# Patient Record
Sex: Female | Born: 1985 | Race: White | Hispanic: No | Smoking: Never smoker
Health system: Southern US, Community
[De-identification: ages and names within clinical notes are randomized; demographics above are authoritative.]

## PROBLEM LIST (undated history)

## (undated) DIAGNOSIS — F32A Depression, unspecified: Secondary | ICD-10-CM

## (undated) DIAGNOSIS — E079 Disorder of thyroid, unspecified: Secondary | ICD-10-CM

## (undated) DIAGNOSIS — M069 Rheumatoid arthritis, unspecified: Secondary | ICD-10-CM

## (undated) DIAGNOSIS — D649 Anemia, unspecified: Secondary | ICD-10-CM

## (undated) DIAGNOSIS — Z8619 Personal history of other infectious and parasitic diseases: Secondary | ICD-10-CM

## (undated) DIAGNOSIS — K219 Gastro-esophageal reflux disease without esophagitis: Secondary | ICD-10-CM

## (undated) DIAGNOSIS — C719 Malignant neoplasm of brain, unspecified: Secondary | ICD-10-CM

## (undated) DIAGNOSIS — O24419 Gestational diabetes mellitus in pregnancy, unspecified control: Secondary | ICD-10-CM

## (undated) DIAGNOSIS — F419 Anxiety disorder, unspecified: Secondary | ICD-10-CM

## (undated) HISTORY — DX: Personal history of other infectious and parasitic diseases: Z86.19

## (undated) HISTORY — DX: Gestational diabetes mellitus in pregnancy, unspecified control: O24.419

## (undated) HISTORY — DX: Disorder of thyroid, unspecified: E07.9

## (undated) HISTORY — DX: Malignant neoplasm of brain, unspecified: C71.9

## (undated) HISTORY — DX: Gastro-esophageal reflux disease without esophagitis: K21.9

## (undated) HISTORY — DX: Depression, unspecified: F32.A

## (undated) HISTORY — PX: CRANIOTOMY: SHX93

## (undated) HISTORY — PX: MASTOIDECTOMY: SHX711

## (undated) HISTORY — PX: OTHER SURGICAL HISTORY: SHX169

## (undated) HISTORY — DX: Anxiety disorder, unspecified: F41.9

## (undated) HISTORY — DX: Rheumatoid arthritis, unspecified: M06.9

## (undated) HISTORY — DX: Anemia, unspecified: D64.9

## (undated) HISTORY — PX: WISDOM TOOTH EXTRACTION: SHX21

---
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
## 2008-05-18 ENCOUNTER — Emergency Department (HOSPITAL_COMMUNITY): Admission: EM | Admit: 2008-05-18 | Discharge: 2008-05-19 | Payer: Self-pay | Admitting: Emergency Medicine
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
## 2013-10-05 ENCOUNTER — Encounter: Payer: Self-pay | Admitting: Neurology

## 2013-10-05 ENCOUNTER — Ambulatory Visit (INDEPENDENT_AMBULATORY_CARE_PROVIDER_SITE_OTHER): Payer: BC Managed Care – PPO | Admitting: Neurology

## 2013-10-05 VITALS — BP 104/70 | HR 62 | Temp 97.7°F | Ht 59.0 in | Wt 123.0 lb

## 2013-10-05 DIAGNOSIS — G44229 Chronic tension-type headache, not intractable: Secondary | ICD-10-CM

## 2013-10-05 MED ORDER — AMITRIPTYLINE HCL 10 MG PO TABS: 10.0000 mg | ORAL_TABLET | Freq: Every day | ORAL | Status: DC

## 2013-10-05 NOTE — Patient Instructions (Signed)
You most likely have chronic tension headaches. 1.  I would start amitriptyline 10mg  at bedtime.  It is an antidepressant which is commonly used for chronic headache.  Side effects may include sleepiness, dizziness or dry mouth.  Call in 4 weeks with an update. 2.  Follow up in 3 months.

## 2013-10-05 NOTE — Progress Notes (Signed)
NEUROLOGY CONSULTATION NOTE  Autumn Mccoy MRN: [id number] DOB: 07-12-1986  Referring provider: Dr. Cardell Peach (rheumatologist) Primary care provider: Dr. Maisie Fus  Reason for consult:  New onset chronic daily headache.  HISTORY OF PRESENT ILLNESS: Autumn Mccoy is a 27 year old right-handed (left-handed since surgery) woman with history of remote medulloblastoma status post resection, radiation and chemotherapy in 1990s,  and rheumatoid arthritis, who presents for headache.  Records and images were personally reviewed where available.    Onset:  2 months ago.  No prior history of headache. Locatiion:  diffuse Quality:  Non-throbbing Intensity:  9/10 Aura:  No  Associated symptoms:  Initially a little nausea, but not lately.  No photophobia, phonophobia, osmophobia or visual disturbance.  She also reports some neck pain that started around the same time. Duration:  10 minutes up to several hours Frequency:  daily Triggers/exacerbating factors:  Bending over Relieving factors: none  Past abortive therapy:  Prednisone taper (effective only during the taper) Past preventative therapy:  none  Current abortive therapy:  Ibuprofen (rarely took it as it was ineffective) Current preventative therapy:  none  Caffeine:  Not much Sleep hygiene:  okay Stress/depression:  Mild stress Family history:  Her mother was adopted.  No history of headache but she has MS.  MRI of brain with and without contrast (08/24/13):  Images not available.  Report reads "1.  Stable postoperative appearance of the posterior fossa with no evidence of disease recurrence.  2.  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  No evidence of recent hemorrhage.  3.  No new intracranial abnormality.  4.  Chronic right greater than left mastoid effusions.  PAST MEDICAL HISTORY: Past Medical History  Diagnosis Date  . Rheumatoid  arthritis     PAST SURGICAL HISTORY: Past Surgical History  Procedure Laterality Date  . Brain tumor excision  1995    MEDICATIONS: No current outpatient prescriptions on file prior to visit.   No current facility-administered medications on file prior to visit.    ALLERGIES: No Known Allergies  FAMILY HISTORY: No family history on file.  SOCIAL HISTORY: History   Social History  . Marital Status: Unknown    Spouse Name: N/A    Number of Children: N/A  . Years of Education: N/A   Occupational History  . Not on file.   Social History Main Topics  . Smoking status: Never Smoker   . Smokeless tobacco: Never Used  . Alcohol Use: No  . Drug Use: No  . Sexual Activity: Not on file   Other Topics Concern  . Not on file   Social History Narrative  . No narrative on file    REVIEW OF SYSTEMS: Constitutional: No fevers, chills, or sweats, no generalized fatigue, change in appetite Eyes: No visual changes, double vision, eye pain Ear, nose and throat: No hearing loss, ear pain, nasal congestion, sore throat Cardiovascular: No chest pain, palpitations Respiratory:  No shortness of breath at rest or with exertion, wheezes GastrointestinaI: No nausea, vomiting, diarrhea, abdominal pain, fecal incontinence Genitourinary:  No dysuria, urinary retention or frequency Musculoskeletal:  No neck pain, back pain Integumentary: No rash, pruritus, skin lesions Neurological: as above Psychiatric: No depression, insomnia, anxiety Endocrine: No palpitations, fatigue, diaphoresis, mood swings, change in appetite, change in weight, increased thirst Hematologic/Lymphatic:  No anemia, purpura, petechiae. Allergic/Immunologic: no itchy/runny eyes, nasal congestion, recent allergic reactions, rashes  PHYSICAL EXAM: Filed  Vitals:   10/05/13 1341  BP: 104/70  Pulse: 62  Temp: 97.7 F (36.5 C)   General: No acute distress Head:  Normocephalic/atraumatic Neck: supple, no  paraspinal tenderness, full range of motion Back: No paraspinal tenderness Heart: regular rate and rhythm Lungs: Clear to auscultation bilaterally. Vascular: No carotid bruits. Neurological Exam: Mental status: alert and oriented to person, place, and time, speech fluent and not dysarthric, language intact. Cranial nerves: CN I: not tested CN II: pupils equal, round and reactive to light, visual fields intact, fundi unremarkable. CN III, IV, VI:  full range of motion, no nystagmus, no ptosis CN V: facial sensation intact CN VII: upper and lower face symmetric CN VIII: hearing intact CN IX, X: gag intact, uvula midline CN XI: sternocleidomastoid and trapezius muscles intact CN XII: tongue midline Bulk & Tone: normal, no fasciculations. Motor: 5/5 throughout Sensation: pinprick and vibration intact Deep Tendon Reflexes: 2+ throughout, toes down Finger to nose testing: no dysmetria Heel to shin: no dysmetria Gait: right leg limp due to arthritis of the knee. Some unsteadiness with tandem walking.  Romberg negative.  IMPRESSION: Chronic tension headaches.  Initial nausea suggests migraine but that has resolved and semiology not otherwise indicative of migraine.  PLAN: 1.  Start amitriptyline 10mg  at bedtime.  Side effects discussed.  Call in 4 weeks with update. 2.  Also consider PT of the neck in the future. 3.  Follow up in 3 months.  Thank you for allowing me to take part in the care of this patient.  Shon Millet, DO  CC:  Robb Matar, MD  Mariah Milling, MD

## 2013-11-09 ENCOUNTER — Telehealth: Payer: Self-pay | Admitting: Neurology

## 2013-11-09 NOTE — Telephone Encounter (Signed)
Call about med concerns, CB# [phone]. / Sherri S.

## 2013-11-10 MED ORDER — AMITRIPTYLINE HCL 10 MG PO TABS: 10.0000 mg | ORAL_TABLET | Freq: Every day | ORAL | Status: DC

## 2013-11-10 NOTE — Telephone Encounter (Signed)
Okay to refill amitriptyline 10mg  at bedtime, #30, refill 6

## 2013-11-10 NOTE — Telephone Encounter (Signed)
Patient called stating elavil is working somewhat the headaches are less she is asking for a refill please advise

## 2013-11-20 ENCOUNTER — Telehealth: Payer: Self-pay | Admitting: Neurology

## 2013-11-20 NOTE — Telephone Encounter (Signed)
Called number and left message on voicemail to call us back.

## 2013-11-20 NOTE — Telephone Encounter (Signed)
Patient would like to talk with dr Tomi Likens please call [phone]

## 2013-11-23 NOTE — Telephone Encounter (Signed)
Patient has some questions please call.

## 2013-12-21 LAB — OB RESULTS CONSOLE ABO/RH: RH Type: POSITIVE

## 2013-12-21 LAB — OB RESULTS CONSOLE RPR: RPR: NONREACTIVE

## 2013-12-21 LAB — OB RESULTS CONSOLE GC/CHLAMYDIA
CHLAMYDIA, DNA PROBE: NEGATIVE
GC PROBE AMP, GENITAL: NEGATIVE

## 2013-12-21 LAB — OB RESULTS CONSOLE VARICELLA ZOSTER ANTIBODY, IGG: Varicella: IMMUNE

## 2013-12-21 LAB — OB RESULTS CONSOLE ANTIBODY SCREEN: Antibody Screen: NEGATIVE

## 2013-12-21 LAB — OB RESULTS CONSOLE HEPATITIS B SURFACE ANTIGEN: Hepatitis B Surface Ag: NEGATIVE

## 2013-12-21 LAB — OB RESULTS CONSOLE RUBELLA ANTIBODY, IGM: RUBELLA: IMMUNE

## 2013-12-21 LAB — OB RESULTS CONSOLE HIV ANTIBODY (ROUTINE TESTING): HIV: NONREACTIVE

## 2014-01-05 ENCOUNTER — Ambulatory Visit: Payer: BC Managed Care – PPO | Admitting: Neurology

## 2014-01-05 ENCOUNTER — Telehealth: Payer: Self-pay | Admitting: Neurology

## 2014-01-05 NOTE — Telephone Encounter (Signed)
Pt no showed today's follow up appt w/ Dr. Jaffe. No show letter mailed to pt / Sherri S.  °

## 2014-01-05 NOTE — Telephone Encounter (Signed)
Pt no showed today's follow up appt w/ Dr. Tomi Likens. No show letter mailed to pt / Sherri S.

## 2014-05-14 ENCOUNTER — Inpatient Hospital Stay (HOSPITAL_COMMUNITY): Admission: AD | Admit: 2014-05-14 | Payer: Self-pay | Source: Ambulatory Visit | Admitting: Obstetrics and Gynecology

## 2014-05-19 ENCOUNTER — Encounter: Payer: BC Managed Care – PPO | Attending: Obstetrics and Gynecology

## 2014-05-19 VITALS — Ht 59.0 in | Wt 137.6 lb

## 2014-05-19 DIAGNOSIS — O99810 Abnormal glucose complicating pregnancy: Secondary | ICD-10-CM

## 2014-05-19 DIAGNOSIS — Z713 Dietary counseling and surveillance: Secondary | ICD-10-CM | POA: Insufficient documentation

## 2014-05-20 NOTE — Progress Notes (Signed)
  Patient was seen on 05/19/14 for Gestational Diabetes self-management class at the [facility]. The following learning objectives were met by the patient during this course:   States the definition of Gestational Diabetes  States why dietary management is important in controlling blood glucose  Describes the effects each nutrient has on blood glucose levels  Demonstrates ability to create a balanced meal plan  Demonstrates carbohydrate counting   States when to check blood glucose levels  Demonstrates proper blood glucose monitoring techniques  States the effect of stress and exercise on blood glucose levels  States the importance of limiting caffeine and abstaining from alcohol and smoking  Blood glucose monitor given: Accu Chek Nano BG Monitoring Kit Lot # [id number] Exp: 04/07/2015 Blood glucose reading: 86 mg/dl  Patient instructed to monitor glucose levels: FBS: 60 - <90 1 hour: <140 2 hour: <120  *Patient received handouts:  Nutrition Diabetes and Pregnancy  Carbohydrate Counting List  Patient will be seen for follow-up as needed.

## 2014-06-22 LAB — OB RESULTS CONSOLE GBS: GBS: POSITIVE

## 2014-07-19 ENCOUNTER — Telehealth (HOSPITAL_COMMUNITY): Payer: Self-pay | Admitting: *Deleted

## 2014-07-19 ENCOUNTER — Encounter (HOSPITAL_COMMUNITY): Payer: Self-pay | Admitting: *Deleted

## 2014-07-19 NOTE — Telephone Encounter (Signed)
Preadmission screen  
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
## 2014-07-22 ENCOUNTER — Inpatient Hospital Stay (HOSPITAL_COMMUNITY): Payer: BC Managed Care – PPO | Admitting: Anesthesiology

## 2014-07-22 ENCOUNTER — Encounter (HOSPITAL_COMMUNITY): Payer: Self-pay

## 2014-07-22 ENCOUNTER — Inpatient Hospital Stay (HOSPITAL_COMMUNITY)
Admission: RE | Admit: 2014-07-22 | Discharge: 2014-07-25 | DRG: 766 | Disposition: A | Discharge status: Home / Self Care | Payer: BC Managed Care – PPO | Source: Ambulatory Visit | Attending: Obstetrics and Gynecology | Admitting: Obstetrics and Gynecology

## 2014-07-22 ENCOUNTER — Encounter (HOSPITAL_COMMUNITY)
Admission: RE | Disposition: A | Discharge status: Home / Self Care | Payer: Self-pay | Source: Ambulatory Visit | Attending: Obstetrics and Gynecology

## 2014-07-22 ENCOUNTER — Encounter (HOSPITAL_COMMUNITY): Payer: BC Managed Care – PPO | Admitting: Anesthesiology

## 2014-07-22 VITALS — BP 100/68 | HR 55 | Temp 98.2°F | Resp 18 | Ht 59.0 in | Wt 137.0 lb

## 2014-07-22 DIAGNOSIS — Z3493 Encounter for supervision of normal pregnancy, unspecified, third trimester: Secondary | ICD-10-CM

## 2014-07-22 DIAGNOSIS — O24420 Gestational diabetes mellitus in childbirth, diet controlled: Principal | ICD-10-CM | POA: Diagnosis present

## 2014-07-22 DIAGNOSIS — Z85841 Personal history of malignant neoplasm of brain: Secondary | ICD-10-CM

## 2014-07-22 DIAGNOSIS — Z3A39 39 weeks gestation of pregnancy: Secondary | ICD-10-CM | POA: Diagnosis present

## 2014-07-22 DIAGNOSIS — O99824 Streptococcus B carrier state complicating childbirth: Secondary | ICD-10-CM | POA: Diagnosis present

## 2014-07-22 DIAGNOSIS — Z98891 History of uterine scar from previous surgery: Secondary | ICD-10-CM

## 2014-07-22 LAB — TYPE AND SCREEN
ABO/RH(D): A POS
Antibody Screen: NEGATIVE

## 2014-07-22 LAB — CBC
HCT: 35.3 % — ABNORMAL LOW (ref 36.0–46.0)
Hemoglobin: 12.2 g/dL (ref 12.0–15.0)
MCH: 31.9 pg (ref 26.0–34.0)
MCHC: 34.6 g/dL (ref 30.0–36.0)
MCV: 92.4 fL (ref 78.0–100.0)
Platelets: 197 10*3/uL (ref 150–400)
RBC: 3.82 MIL/uL — ABNORMAL LOW (ref 3.87–5.11)
RDW: 13 % (ref 11.5–15.5)
WBC: 9 10*3/uL (ref 4.0–10.5)

## 2014-07-22 LAB — RPR

## 2014-07-22 LAB — ABO/RH: ABO/RH(D): A POS

## 2014-07-22 LAB — GLUCOSE, CAPILLARY
GLUCOSE-CAPILLARY: 101 mg/dL — AB (ref 70–99)
Glucose-Capillary: 79 mg/dL (ref 70–99)

## 2014-07-22 SURGERY — Surgical Case
Anesthesia: Epidural | Site: Abdomen

## 2014-07-22 MED ORDER — LACTATED RINGERS IV SOLN: 500.0000 mL | Freq: Once | INTRAVENOUS | Status: DC

## 2014-07-22 MED ORDER — OXYTOCIN BOLUS FROM INFUSION: 500.0000 mL | INTRAVENOUS | Status: DC

## 2014-07-22 MED ORDER — LACTATED RINGERS IV SOLN
500.0000 mL | INTRAVENOUS | Status: DC | PRN
  Administered 2014-07-22: 1000 mL via INTRAVENOUS

## 2014-07-22 MED ORDER — EPHEDRINE 5 MG/ML INJ: 10.0000 mg | INTRAVENOUS | Status: DC | PRN

## 2014-07-22 MED ORDER — TERBUTALINE SULFATE 1 MG/ML IJ SOLN: 0.2500 mg | Freq: Once | INTRAMUSCULAR | Status: AC | PRN

## 2014-07-22 MED ORDER — SODIUM BICARBONATE 8.4 % IV SOLN
INTRAVENOUS | Status: AC
  Filled 2014-07-22: qty 50

## 2014-07-22 MED ORDER — OXYTOCIN 10 UNIT/ML IJ SOLN
40.0000 [IU] | INTRAVENOUS | Status: DC | PRN
  Administered 2014-07-22: 40 [IU] via INTRAVENOUS

## 2014-07-22 MED ORDER — OXYTOCIN 10 UNIT/ML IJ SOLN: INTRAMUSCULAR | Status: DC | PRN

## 2014-07-22 MED ORDER — LIDOCAINE-EPINEPHRINE (PF) 2 %-1:200000 IJ SOLN
INTRAMUSCULAR | Status: AC
  Filled 2014-07-22: qty 20

## 2014-07-22 MED ORDER — ONDANSETRON HCL 4 MG/2ML IJ SOLN: 4.0000 mg | Freq: Four times a day (QID) | INTRAMUSCULAR | Status: DC | PRN

## 2014-07-22 MED ORDER — LIDOCAINE HCL (PF) 1 % IJ SOLN
30.0000 mL | INTRAMUSCULAR | Status: DC | PRN
  Filled 2014-07-22: qty 30

## 2014-07-22 MED ORDER — MORPHINE SULFATE (PF) 0.5 MG/ML IJ SOLN
INTRAMUSCULAR | Status: DC | PRN
  Administered 2014-07-22: 3 mg via EPIDURAL

## 2014-07-22 MED ORDER — DIPHENHYDRAMINE HCL 50 MG/ML IJ SOLN: 12.5000 mg | INTRAMUSCULAR | Status: DC | PRN

## 2014-07-22 MED ORDER — ACETAMINOPHEN 325 MG PO TABS
650.0000 mg | ORAL_TABLET | ORAL | Status: DC | PRN
Start: 2014-07-22 — End: 2014-07-23

## 2014-07-22 MED ORDER — LACTATED RINGERS IV SOLN
INTRAVENOUS | Status: DC | PRN
  Administered 2014-07-22 (×2): via INTRAVENOUS

## 2014-07-22 MED ORDER — FENTANYL 2.5 MCG/ML BUPIVACAINE 1/10 % EPIDURAL INFUSION (WH - ANES)
14.0000 mL/h | INTRAMUSCULAR | Status: DC | PRN
  Administered 2014-07-22 (×2): 14 mL/h via EPIDURAL
  Filled 2014-07-22 (×2): qty 125

## 2014-07-22 MED ORDER — LACTATED RINGERS IV SOLN
INTRAVENOUS | Status: DC
  Administered 2014-07-22 (×7): via INTRAVENOUS

## 2014-07-22 MED ORDER — CITRIC ACID-SODIUM CITRATE 334-500 MG/5ML PO SOLN
30.0000 mL | ORAL | Status: DC | PRN
  Administered 2014-07-22: 30 mL via ORAL
  Filled 2014-07-22: qty 15

## 2014-07-22 MED ORDER — PENICILLIN G POTASSIUM 5000000 UNITS IJ SOLR
2.5000 10*6.[IU] | INTRAVENOUS | Status: DC
  Administered 2014-07-22 (×3): 2.5 10*6.[IU] via INTRAVENOUS
  Filled 2014-07-22 (×5): qty 2.5

## 2014-07-22 MED ORDER — OXYTOCIN 10 UNIT/ML IJ SOLN
INTRAMUSCULAR | Status: AC
  Filled 2014-07-22: qty 4

## 2014-07-22 MED ORDER — SODIUM BICARBONATE 8.4 % IV SOLN
INTRAVENOUS | Status: DC | PRN
  Administered 2014-07-22 (×2): 5 mL via EPIDURAL

## 2014-07-22 MED ORDER — PHENYLEPHRINE 40 MCG/ML (10ML) SYRINGE FOR IV PUSH (FOR BLOOD PRESSURE SUPPORT)
PREFILLED_SYRINGE | INTRAVENOUS | Status: AC
  Filled 2014-07-22: qty 5

## 2014-07-22 MED ORDER — PHENYLEPHRINE 40 MCG/ML (10ML) SYRINGE FOR IV PUSH (FOR BLOOD PRESSURE SUPPORT): 80.0000 ug | PREFILLED_SYRINGE | INTRAVENOUS | Status: DC | PRN

## 2014-07-22 MED ORDER — PHENYLEPHRINE 40 MCG/ML (10ML) SYRINGE FOR IV PUSH (FOR BLOOD PRESSURE SUPPORT)
80.0000 ug | PREFILLED_SYRINGE | INTRAVENOUS | Status: DC | PRN
  Filled 2014-07-22: qty 10

## 2014-07-22 MED ORDER — CEFAZOLIN SODIUM-DEXTROSE 2-3 GM-% IV SOLR
INTRAVENOUS | Status: AC
  Filled 2014-07-22: qty 50

## 2014-07-22 MED ORDER — OXYCODONE-ACETAMINOPHEN 5-325 MG PO TABS: 2.0000 | ORAL_TABLET | ORAL | Status: DC | PRN

## 2014-07-22 MED ORDER — BUTORPHANOL TARTRATE 1 MG/ML IJ SOLN: 1.0000 mg | INTRAMUSCULAR | Status: DC | PRN

## 2014-07-22 MED ORDER — ONDANSETRON HCL 4 MG/2ML IJ SOLN
INTRAMUSCULAR | Status: AC
  Filled 2014-07-22: qty 2

## 2014-07-22 MED ORDER — CEFAZOLIN SODIUM-DEXTROSE 2-3 GM-% IV SOLR
2.0000 g | INTRAVENOUS | Status: DC
  Filled 2014-07-22: qty 50

## 2014-07-22 MED ORDER — OXYTOCIN 40 UNITS IN LACTATED RINGERS INFUSION - SIMPLE MED
1.0000 m[IU]/min | INTRAVENOUS | Status: DC
  Administered 2014-07-22: 2 m[IU]/min via INTRAVENOUS
  Administered 2014-07-22: 6 m[IU]/min via INTRAVENOUS
  Filled 2014-07-22: qty 1000

## 2014-07-22 MED ORDER — MORPHINE SULFATE 0.5 MG/ML IJ SOLN
INTRAMUSCULAR | Status: AC
  Filled 2014-07-22: qty 10

## 2014-07-22 MED ORDER — LIDOCAINE HCL (PF) 1 % IJ SOLN
INTRAMUSCULAR | Status: DC | PRN
  Administered 2014-07-22 (×2): 5 mL

## 2014-07-22 MED ORDER — OXYCODONE-ACETAMINOPHEN 5-325 MG PO TABS: 1.0000 | ORAL_TABLET | ORAL | Status: DC | PRN

## 2014-07-22 MED ORDER — PENICILLIN G POTASSIUM 5000000 UNITS IJ SOLR
5.0000 10*6.[IU] | Freq: Once | INTRAVENOUS | Status: AC
  Administered 2014-07-22: 5 10*6.[IU] via INTRAVENOUS
  Filled 2014-07-22: qty 5

## 2014-07-22 MED ORDER — ONDANSETRON HCL 4 MG/2ML IJ SOLN
INTRAMUSCULAR | Status: DC | PRN
  Administered 2014-07-22: 4 mg via INTRAVENOUS

## 2014-07-22 MED ORDER — CEFAZOLIN (ANCEF) 1 G IV SOLR: 2.0000 g | INTRAVENOUS | Status: DC

## 2014-07-22 MED ORDER — CEFAZOLIN SODIUM-DEXTROSE 2-3 GM-% IV SOLR
INTRAVENOUS | Status: DC | PRN
  Administered 2014-07-22: 2 g via INTRAVENOUS

## 2014-07-22 MED ORDER — OXYTOCIN 40 UNITS IN LACTATED RINGERS INFUSION - SIMPLE MED: 62.5000 mL/h | INTRAVENOUS | Status: DC

## 2014-07-22 SURGICAL SUPPLY — 33 items
APL SKNCLS STERI-STRIP NONHPOA (GAUZE/BANDAGES/DRESSINGS) ×1
BENZOIN TINCTURE PRP APPL 2/3 (GAUZE/BANDAGES/DRESSINGS) ×2 IMPLANT
CLAMP CORD UMBIL (MISCELLANEOUS) IMPLANT
CLOTH BEACON ORANGE TIMEOUT ST (SAFETY) ×2 IMPLANT
DRAPE SHEET LG 3/4 BI-LAMINATE (DRAPES) IMPLANT
DRSG OPSITE POSTOP 4X10 (GAUZE/BANDAGES/DRESSINGS) ×2 IMPLANT
DURAPREP 26ML APPLICATOR (WOUND CARE) ×2 IMPLANT
ELECT REM PT RETURN 9FT ADLT (ELECTROSURGICAL) ×2
ELECTRODE REM PT RTRN 9FT ADLT (ELECTROSURGICAL) ×1 IMPLANT
EXTRACTOR VACUUM KIWI (MISCELLANEOUS) IMPLANT
GLOVE BIO SURGEON STRL SZ 6.5 (GLOVE) ×2 IMPLANT
GOWN STRL REUS W/TWL LRG LVL3 (GOWN DISPOSABLE) ×4 IMPLANT
KIT ABG SYR 3ML LUER SLIP (SYRINGE) IMPLANT
NDL HYPO 25X5/8 SAFETYGLIDE (NEEDLE) IMPLANT
NEEDLE HYPO 25X5/8 SAFETYGLIDE (NEEDLE) IMPLANT
NS IRRIG 1000ML POUR BTL (IV SOLUTION) ×2 IMPLANT
PACK C SECTION WH (CUSTOM PROCEDURE TRAY) ×2 IMPLANT
PAD OB MATERNITY 4.3X12.25 (PERSONAL CARE ITEMS) ×2 IMPLANT
RTRCTR C-SECT PINK 25CM LRG (MISCELLANEOUS) ×2 IMPLANT
STRIP CLOSURE SKIN 1/2X4 (GAUZE/BANDAGES/DRESSINGS) ×2 IMPLANT
SUT CHROMIC 1 CTX 36 (SUTURE) ×4 IMPLANT
SUT PLAIN 0 NONE (SUTURE) IMPLANT
SUT PLAIN 2 0 XLH (SUTURE) ×2 IMPLANT
SUT VIC AB 0 CT1 27 (SUTURE) ×4
SUT VIC AB 0 CT1 27XBRD ANBCTR (SUTURE) ×2 IMPLANT
SUT VIC AB 2-0 CT1 27 (SUTURE) ×2
SUT VIC AB 2-0 CT1 TAPERPNT 27 (SUTURE) ×1 IMPLANT
SUT VIC AB 3-0 CT1 27 (SUTURE)
SUT VIC AB 3-0 CT1 TAPERPNT 27 (SUTURE) IMPLANT
SUT VIC AB 4-0 KS 27 (SUTURE) ×2 IMPLANT
TOWEL OR 17X24 6PK STRL BLUE (TOWEL DISPOSABLE) ×2 IMPLANT
TRAY FOLEY CATH 14FR (SET/KITS/TRAYS/PACK) ×1 IMPLANT
WATER STERILE IRR 1000ML POUR (IV SOLUTION) ×1 IMPLANT

## 2014-07-22 NOTE — Progress Notes (Signed)
Patient ID: Autumn Mccoy, female   DOB: 1985-11-24, 28 y.o.   MRN: [id number] Pt still comfortable with epidural  afeb vss Cervix c/rim/+1 Cervix reducible but fetal vertex still OP Attempted pushing with several contractions to see if vertex would rotate and descend, But after 15 mins of pushing no reall progress was made despite good effort.  FHR having repetitive variable decels with pushing and slowed recovery so pitocin d/c'd for 15 min and then restarted at 48mu.

## 2014-07-22 NOTE — Progress Notes (Signed)
Patient ID: Fleeta Emmer Katen, female   DOB: [birth year], 28 y.o.   MRN: [id number] Pt still comfortable with epidural FHR with good variability but repetitive deep variable decels with pushing  Cervix still has reducible rim and vertex not advanced any in last 3 hours with passive labor--OP  Attempted pushing again to see if vertex would advance.  No real progress made and FHR began to have long deep variables to 60  With prolonged recovery.   D/w pt and family situation in detail and that I feel she is going to need a c-section to deliver. We discussed the risks of bleeding, infection and possible damage to bowel and bladder. Pt agreeable to proceeding.   OR notified and pitocin d/c'd.  FHR stable off pitocin.  Will proceed when OR ready.

## 2014-07-22 NOTE — Progress Notes (Signed)
Patient ID: Autumn Mccoy, female   DOB: 02-04-1986, 28 y.o.   MRN: [id number] Pt comfortable with epidural.  Some pressure  afeb vss Cervix c/9+/0 Fels OP  FHR baseline 120 with good variability, some variable decels Will continue to follow progress and allow vertex to descend more before pushing

## 2014-07-22 NOTE — Transfer of Care (Signed)
Immediate Anesthesia Transfer of Care Note  Patient: Autumn Mccoy  Procedure(s) Performed: Procedure(s): CESAREAN SECTION (N/A)  Patient Location: PACU  Anesthesia Type:Epidural  Level of Consciousness: awake, alert , oriented and patient cooperative  Airway & Oxygen Therapy: Patient Spontanous Breathing  Post-op Assessment: Report given to PACU RN and Post -op Vital signs reviewed and stable  Post vital signs: Reviewed and stable  Complications: No apparent anesthesia complications

## 2014-07-22 NOTE — Progress Notes (Signed)
Patient ID: Autumn Mccoy, female   DOB: 1986-07-26, 28 y.o.   MRN: [id number] Pt with mild contractions  afeb vss FHR category 1  Cervix 70/3/-2 AROM slightly blood tinged, but no meconium  PCN on board for +GBS  Pitocin at 52mu. Follow progress  Plans epidural.

## 2014-07-22 NOTE — Anesthesia Preprocedure Evaluation (Addendum)
Anesthesia Evaluation  Patient identified by MRN, date of birth, ID band Patient awake    Reviewed: Allergy & Precautions, H&P , NPO status , Patient's Chart, lab work & pertinent test results  Airway Mallampati: II TM Distance: >3 FB Neck ROM: full    Dental   Pulmonary neg pulmonary ROS,  breath sounds clear to auscultation  Pulmonary exam normal       Cardiovascular negative cardio ROS  Rhythm:regular Rate:Normal     Neuro/Psych negative neurological ROS  negative psych ROS   GI/Hepatic negative GI ROS, Neg liver ROS,   Endo/Other  diabetes  Renal/GU      Musculoskeletal  (+) Arthritis -, Rheumatoid disorders,    Abdominal Normal abdominal exam  (+)   Peds  Hematology negative hematology ROS (+) anemia ,   Anesthesia Other Findings S/p brain tumor resection at age 28- no residual issues Scoliosis RA- off humera during pregnancy  Reproductive/Obstetrics (+) Pregnancy                        Anesthesia Physical Anesthesia Plan  ASA: III and emergent  Anesthesia Plan: Epidural   Post-op Pain Management:    Induction:   Airway Management Planned: Natural Airway  Additional Equipment:   Intra-op Plan:   Post-operative Plan:   Informed Consent: I have reviewed the patients History and Physical, chart, labs and discussed the procedure including the risks, benefits and alternatives for the proposed anesthesia with the patient or authorized representative who has indicated his/her understanding and acceptance.     Plan Discussed with: Anesthesiologist, CRNA and Surgeon  Anesthesia Plan Comments: (Patient for C/Section for arrest of descent and decelerations. Will use epidural for C/Section. M.Royce Macadamia, MD)      Anesthesia Quick Evaluation

## 2014-07-22 NOTE — Progress Notes (Signed)
Patient ID: Autumn Mccoy, female   DOB: March 27, 1986, 28 y.o.   MRN: [id number] Pt comfortable with epidural FHR category 1 Cervix 90/5/-1  IUPC placed Continue to follow progress.

## 2014-07-22 NOTE — Anesthesia Procedure Notes (Signed)
Epidural Patient location during procedure: OB Start time: 07/22/2014 10:53 AM  Staffing Anesthesiologist: Rudean Curt Performed by: anesthesiologist   Preanesthetic Checklist Completed: patient identified, site marked, surgical consent, pre-op evaluation, timeout performed, IV checked, risks and benefits discussed and monitors and equipment checked  Epidural Patient position: sitting Prep: site prepped and draped and DuraPrep Patient monitoring: continuous pulse ox and blood pressure Approach: midline Location: L3-L4 Injection technique: LOR air  Needle:  Needle type: Tuohy  Needle gauge: 17 G Needle length: 9 cm and 9 Needle insertion depth: 5 cm cm Catheter type: closed end flexible Catheter size: 19 Gauge Catheter at skin depth: 10 cm Test dose: negative  Assessment Events: blood not aspirated, injection not painful, no injection resistance, negative IV test and no paresthesia  Additional Notes Patient identified.  Risk benefits discussed including failed block, incomplete pain control, headache, nerve damage, paralysis, blood pressure changes, nausea, vomiting, reactions to medication both toxic or allergic, and postpartum back pain.  Patient expressed understanding and wished to proceed.  All questions were answered.  Sterile technique used throughout procedure and epidural site dressed with sterile barrier dressing. No paresthesia or other complications noted.The patient did not experience any signs of intravascular injection such as tinnitus or metallic taste in mouth nor signs of intrathecal spread such as rapid motor block. Please see nursing notes for vital signs.

## 2014-07-22 NOTE — Op Note (Addendum)
Operative note  Preoperative diagnosis Term pregnancy at 39 weeks and 4 days Gestational diabetes mellitus Arrest of dilation and descent OP presentation  Postoperative diagnosis Same  Procedure Primary low transverse C-section with 2 layer closure of uterus  Surgeon Dr. Paula Compton  Anesthesia Epidural  Fluids Estimated blood loss 800 cc Urine output 300 cc blood-tinged urine, clearing by the end of case IV fluids 1800 cc LR  Findings There was a viable female infant in the vertex presentation. Apgars were 8 and 9. Weight pending at time of dictation. Uterus ovaries and tubes were normal in appearance.  Specimen Placenta sent to L&D  Procedure note Patient was taken to the operating room where epidural anesthesia was found to be adequate by Allis clamp test. She was prepped and draped in the normal sterile fashion in the dorsal supine position with a leftward tilt. An appropriate time out was performed. A Pfannenstiel skin incision was then made with the scalpel and carried through to the underlying layer of fascia by sharp dissection and Bovie cautery. The fascia was nicked in the midline and the incision was extended laterally with Mayo scissors. The superior aspect was dissected off the rectus muscles. Rectus muscles were separated in the midline and the peritoneal cavity entered bluntly. The peritoneal incision was then extended both superiorly and inferiorly with careful attention to avoid both bowel and bladder. The Alexis self-retaining wound retractor was then placed within the incision and the lower uterine segment exposed. The bladder flap was developed with Metzenbaum scissors and pushed away from the lower uterine segment. The lower uterine segment was then incised in a transverse fashion and the cavity itself entered bluntly. The incision was extended bluntly. The infant's head was then lifted and delivered from the incision without difficulty. The remainder of the  infant delivered and the nose and mouth bulb suctioned with the cord clamped and cut as well. The infant was handed off to the waiting pediatricians. The placenta was then spontaneously expressed from the uterus and the uterus cleared of all clots and debris with moist lap sponge. The uterine incision was then repaired in 2 layers the first layer was a running locked layer 1-0 chromic and the second an imbricating layer of the same suture. The tubes and ovaries were inspected and the gutters cleared of all clots and debris. The uterine incision was inspected and found to be hemostatic  There was a small window in the left broad ligament but it was not closed due to its proximity to the bladder. All appeared hemostatic in that area. All instruments and sponges as well as the Alexis retractor were then removed from the abdomen. The rectus muscles and peritoneum were then reapproximated with several interrupted mattress sutures of 2-0 Vicryl. The fascia was then closed with 0 Vicryl in a running fashion. Subcutaneous tissue was reapproximated with 3-0 plain in a running fashion. The skin was closed with a subcuticular stitch of 4-0 Vicryl on a Keith needle and then reinforced with benzoin and Steri-Strips. At the conclusion of the procedure all instruments and sponge counts were correct. Patient was taken to the recovery room in good condition with her baby accompanying her skin to skin.

## 2014-07-23 ENCOUNTER — Encounter (HOSPITAL_COMMUNITY): Payer: Self-pay

## 2014-07-23 DIAGNOSIS — Z98891 History of uterine scar from previous surgery: Secondary | ICD-10-CM

## 2014-07-23 LAB — COMPREHENSIVE METABOLIC PANEL
ALBUMIN: 1.9 g/dL — AB (ref 3.5–5.2)
ALK PHOS: 92 U/L (ref 39–117)
ALT: 11 U/L (ref 0–35)
ANION GAP: 11 (ref 5–15)
AST: 30 U/L (ref 0–37)
BUN: 5 mg/dL — AB (ref 6–23)
CALCIUM: 8.3 mg/dL — AB (ref 8.4–10.5)
CO2: 22 mEq/L (ref 19–32)
CREATININE: 0.67 mg/dL (ref 0.50–1.10)
Chloride: 102 mEq/L (ref 96–112)
GFR calc Af Amer: >90 mL/min (ref >90)
GFR calc non Af Amer: >90 mL/min (ref >90)
Glucose, Bld: 105 mg/dL — ABNORMAL HIGH (ref 70–99)
POTASSIUM: 4.2 meq/L (ref 3.7–5.3)
Sodium: 135 mEq/L — ABNORMAL LOW (ref 137–147)
TOTAL PROTEIN: 4.8 g/dL — AB (ref 6.0–8.3)
Total Bilirubin: 0.4 mg/dL (ref 0.3–1.2)

## 2014-07-23 LAB — CBC
HCT: 31 % — ABNORMAL LOW (ref 36.0–46.0)
Hemoglobin: 11 g/dL — ABNORMAL LOW (ref 12.0–15.0)
MCH: 32.4 pg (ref 26.0–34.0)
MCHC: 35.5 g/dL (ref 30.0–36.0)
MCV: 91.4 fL (ref 78.0–100.0)
Platelets: 135 10*3/uL — ABNORMAL LOW (ref 150–400)
RBC: 3.39 MIL/uL — ABNORMAL LOW (ref 3.87–5.11)
RDW: 12.8 % (ref 11.5–15.5)
WBC: 15.1 10*3/uL — ABNORMAL HIGH (ref 4.0–10.5)

## 2014-07-23 MED ORDER — KETOROLAC TROMETHAMINE 30 MG/ML IJ SOLN
30.0000 mg | Freq: Once | INTRAMUSCULAR | Status: AC
  Administered 2014-07-23: 30 mg via INTRAVENOUS

## 2014-07-23 MED ORDER — FENTANYL CITRATE 0.05 MG/ML IJ SOLN
INTRAMUSCULAR | Status: AC
  Filled 2014-07-23: qty 2

## 2014-07-23 MED ORDER — ONDANSETRON HCL 4 MG/2ML IJ SOLN: 4.0000 mg | Freq: Three times a day (TID) | INTRAMUSCULAR | Status: DC | PRN

## 2014-07-23 MED ORDER — NALBUPHINE HCL 10 MG/ML IJ SOLN: 5.0000 mg | INTRAMUSCULAR | Status: DC | PRN

## 2014-07-23 MED ORDER — MEPERIDINE HCL 25 MG/ML IJ SOLN
INTRAMUSCULAR | Status: AC
  Filled 2014-07-23: qty 1

## 2014-07-23 MED ORDER — ONDANSETRON HCL 4 MG/2ML IJ SOLN: 4.0000 mg | Freq: Four times a day (QID) | INTRAMUSCULAR | Status: DC | PRN

## 2014-07-23 MED ORDER — ONDANSETRON HCL 4 MG/2ML IJ SOLN: 4.0000 mg | INTRAMUSCULAR | Status: DC | PRN

## 2014-07-23 MED ORDER — OXYCODONE-ACETAMINOPHEN 5-325 MG PO TABS: 1.0000 | ORAL_TABLET | ORAL | Status: DC | PRN

## 2014-07-23 MED ORDER — MENTHOL 3 MG MT LOZG
1.0000 | LOZENGE | OROMUCOSAL | Status: DC | PRN
Start: 2014-07-23 — End: 2014-07-25

## 2014-07-23 MED ORDER — SIMETHICONE 80 MG PO CHEW: 80.0000 mg | CHEWABLE_TABLET | ORAL | Status: DC | PRN

## 2014-07-23 MED ORDER — NALOXONE HCL 0.4 MG/ML IJ SOLN: 0.4000 mg | INTRAMUSCULAR | Status: DC | PRN

## 2014-07-23 MED ORDER — DIBUCAINE 1 % RE OINT: 1.0000 "application " | TOPICAL_OINTMENT | RECTAL | Status: DC | PRN

## 2014-07-23 MED ORDER — ZOLPIDEM TARTRATE 5 MG PO TABS: 5.0000 mg | ORAL_TABLET | Freq: Every evening | ORAL | Status: DC | PRN

## 2014-07-23 MED ORDER — DIPHENHYDRAMINE HCL 25 MG PO CAPS: 25.0000 mg | ORAL_CAPSULE | Freq: Four times a day (QID) | ORAL | Status: DC | PRN

## 2014-07-23 MED ORDER — MORPHINE SULFATE 4 MG/ML IJ SOLN
4.0000 mg | Freq: Once | INTRAMUSCULAR | Status: AC
  Administered 2014-07-23: 4 mg via INTRAVENOUS

## 2014-07-23 MED ORDER — INFLUENZA VAC SPLIT QUAD 0.5 ML IM SUSY
0.5000 mL | PREFILLED_SYRINGE | INTRAMUSCULAR | Status: AC
  Administered 2014-07-24: 0.5 mL via INTRAMUSCULAR

## 2014-07-23 MED ORDER — OXYTOCIN 40 UNITS IN LACTATED RINGERS INFUSION - SIMPLE MED: 62.5000 mL/h | INTRAVENOUS | Status: AC

## 2014-07-23 MED ORDER — SODIUM CHLORIDE 0.9 % IJ SOLN: 9.0000 mL | INTRAMUSCULAR | Status: DC | PRN

## 2014-07-23 MED ORDER — FENTANYL CITRATE 0.05 MG/ML IJ SOLN
25.0000 ug | INTRAMUSCULAR | Status: DC | PRN
  Administered 2014-07-23 (×4): 50 ug via INTRAVENOUS

## 2014-07-23 MED ORDER — HYDROMORPHONE HCL 1 MG/ML IJ SOLN
INTRAMUSCULAR | Status: AC
  Filled 2014-07-23: qty 1

## 2014-07-23 MED ORDER — LACTATED RINGERS IV SOLN: INTRAVENOUS | Status: DC

## 2014-07-23 MED ORDER — NALBUPHINE HCL 10 MG/ML IJ SOLN: 5.0000 mg | Freq: Once | INTRAMUSCULAR | Status: AC | PRN

## 2014-07-23 MED ORDER — TETANUS-DIPHTH-ACELL PERTUSSIS 5-2.5-18.5 LF-MCG/0.5 IM SUSP: 0.5000 mL | Freq: Once | INTRAMUSCULAR | Status: DC

## 2014-07-23 MED ORDER — OXYCODONE-ACETAMINOPHEN 5-325 MG PO TABS
2.0000 | ORAL_TABLET | ORAL | Status: DC | PRN
  Administered 2014-07-23 – 2014-07-25 (×9): 2 via ORAL
  Filled 2014-07-23 (×9): qty 2

## 2014-07-23 MED ORDER — MORPHINE SULFATE (PF) 1 MG/ML IV SOLN
INTRAVENOUS | Status: DC
  Administered 2014-07-23: 07:00:00 via INTRAVENOUS
  Filled 2014-07-23: qty 25

## 2014-07-23 MED ORDER — LACTATED RINGERS IV BOLUS (SEPSIS)
500.0000 mL | Freq: Once | INTRAVENOUS | Status: AC
  Administered 2014-07-23: 500 mL via INTRAVENOUS

## 2014-07-23 MED ORDER — DIPHENHYDRAMINE HCL 25 MG PO CAPS: 25.0000 mg | ORAL_CAPSULE | ORAL | Status: DC | PRN

## 2014-07-23 MED ORDER — MORPHINE SULFATE 4 MG/ML IJ SOLN
INTRAMUSCULAR | Status: AC
  Filled 2014-07-23: qty 1

## 2014-07-23 MED ORDER — SIMETHICONE 80 MG PO CHEW
80.0000 mg | CHEWABLE_TABLET | Freq: Three times a day (TID) | ORAL | Status: DC
  Administered 2014-07-23 – 2014-07-25 (×7): 80 mg via ORAL
  Filled 2014-07-23 (×7): qty 1

## 2014-07-23 MED ORDER — MEPERIDINE HCL 25 MG/ML IJ SOLN
6.2500 mg | INTRAMUSCULAR | Status: DC | PRN
  Administered 2014-07-23: 6.25 mg via INTRAVENOUS

## 2014-07-23 MED ORDER — WITCH HAZEL-GLYCERIN EX PADS
1.0000 | MEDICATED_PAD | CUTANEOUS | Status: DC | PRN
Start: 2014-07-23 — End: 2014-07-25

## 2014-07-23 MED ORDER — PRENATAL MULTIVITAMIN CH
1.0000 | ORAL_TABLET | Freq: Every day | ORAL | Status: DC
  Administered 2014-07-24 – 2014-07-25 (×2): 1 via ORAL
  Filled 2014-07-23 (×2): qty 1

## 2014-07-23 MED ORDER — SENNOSIDES-DOCUSATE SODIUM 8.6-50 MG PO TABS
2.0000 | ORAL_TABLET | ORAL | Status: DC
  Administered 2014-07-24 (×2): 2 via ORAL
  Filled 2014-07-23 (×2): qty 2

## 2014-07-23 MED ORDER — SCOPOLAMINE 1 MG/3DAYS TD PT72
MEDICATED_PATCH | TRANSDERMAL | Status: AC
  Filled 2014-07-23: qty 1

## 2014-07-23 MED ORDER — KETOROLAC TROMETHAMINE 30 MG/ML IJ SOLN
INTRAMUSCULAR | Status: AC
  Filled 2014-07-23: qty 1

## 2014-07-23 MED ORDER — SCOPOLAMINE 1 MG/3DAYS TD PT72
1.0000 | MEDICATED_PATCH | Freq: Once | TRANSDERMAL | Status: DC
  Administered 2014-07-23: 1.5 mg via TRANSDERMAL

## 2014-07-23 MED ORDER — SODIUM CHLORIDE 0.9 % IJ SOLN: 3.0000 mL | INTRAMUSCULAR | Status: DC | PRN

## 2014-07-23 MED ORDER — HYDROMORPHONE HCL 1 MG/ML IJ SOLN
0.5000 mg | INTRAMUSCULAR | Status: AC | PRN
  Administered 2014-07-23 (×4): 0.5 mg via INTRAVENOUS

## 2014-07-23 MED ORDER — SIMETHICONE 80 MG PO CHEW
80.0000 mg | CHEWABLE_TABLET | ORAL | Status: DC
  Administered 2014-07-24 (×2): 80 mg via ORAL
  Filled 2014-07-23 (×2): qty 1

## 2014-07-23 MED ORDER — IBUPROFEN 600 MG PO TABS
600.0000 mg | ORAL_TABLET | Freq: Four times a day (QID) | ORAL | Status: DC
  Administered 2014-07-23 – 2014-07-25 (×9): 600 mg via ORAL
  Filled 2014-07-23 (×9): qty 1

## 2014-07-23 MED ORDER — NALOXONE HCL 1 MG/ML IJ SOLN
1.0000 ug/kg/h | INTRAVENOUS | Status: DC | PRN
  Filled 2014-07-23: qty 2

## 2014-07-23 MED ORDER — ONDANSETRON HCL 4 MG PO TABS: 4.0000 mg | ORAL_TABLET | ORAL | Status: DC | PRN

## 2014-07-23 MED ORDER — DIPHENHYDRAMINE HCL 50 MG/ML IJ SOLN: 12.5000 mg | INTRAMUSCULAR | Status: DC | PRN

## 2014-07-23 MED ORDER — LANOLIN HYDROUS EX OINT: 1.0000 "application " | TOPICAL_OINTMENT | CUTANEOUS | Status: DC | PRN

## 2014-07-23 NOTE — Addendum Note (Signed)
Addendum created 07/23/14 0809 by Billie Lade, CRNA   Modules edited: Notes Section   Notes Section:  File: [id number]

## 2014-07-23 NOTE — Progress Notes (Signed)
Patient reports stabbing pain at left side of incision rate of 9.  Offered to call Dr. Royce Macadamia for PCA and/or other pain meds. Patient does not want to take any more medication; agreed to try ice pack; says position change does not help.  Called Dr. Royce Macadamia who asked me to consult Dr. Marvel Plan.  Called Dr. Marvel Plan who ordered LR bolus of 500 cc, stat CBC and CMP. She is en route to see patient.

## 2014-07-23 NOTE — Lactation Note (Signed)
This note was copied from the chart of Autumn Aerielle Eguia. Lactation Consultation Note Initial consultation; baby 57 hours old; RN has positioned baby in football, baby latches very well with rhythmic sucking and occasional audible swallowing. Mom denies breast, nipple pain. At this time, mom is in pain from surgery and is very sleepy.  Mom encouraged to feed baby 8-12 times/24 hours and with feeding cues.  Mom made aware of O/P services, breastfeeding support groups, community resources, and our phone # for post-discharge questions. Given mom's post-op discomfort and her fatigue, this is not an ideal time for extensive teaching. Br feeding basics will likely need to be reviewed.   Patient Name: Autumn Mccoy [id number] Date: 07/23/2014 Reason for consult: Initial assessment   Maternal Data    Feeding Feeding Type: Breast Fed Length of feed: 15 min  LATCH Score/Interventions Latch: Grasps breast easily, tongue down, lips flanged, rhythmical sucking.  Audible Swallowing: A few with stimulation  Type of Nipple: Everted at rest and after stimulation  Comfort (Breast/Nipple): Soft / non-tender     Hold (Positioning): Assistance needed to correctly position infant at breast and maintain latch.  LATCH Score: 8  Lactation Tools Discussed/Used     Consult Status Consult Status: Follow-up Follow-up type: In-patient    [place] [facility] 07/23/2014, 1:52 PM

## 2014-07-23 NOTE — Progress Notes (Signed)
Discontinued pca pump (morphine) per MD order. Wasted with Roselie Awkward, RN ( 11 ml used, wasted 14 ml)

## 2014-07-23 NOTE — Progress Notes (Signed)
Patient ID: Autumn Mccoy, female   DOB: 1985-10-21, 29 y.o.   MRN: [id number] Pt reports no significant pain. Her urine is yellow and clear. Her abdomen is soft and appropriately tender.

## 2014-07-23 NOTE — Progress Notes (Signed)
Patient ID: Autumn Mccoy, female   DOB: 1986/02/20, 28 y.o.   MRN: [id number] CTSP for persistent pain on the left side.  Pt states she began to have pain on her left side in the PACU and was only partially relieved with fentanyl, dilaudid, and morphine.  She is having no N/V and her vitals are all WNL. She has been eating crackers and drinking clears.  She states her pain is worse with movement.  She rates it an 8/10 but is resting comfortably in bed.  BP 110/79  Pulse 70-80 Abdomen soft, uterus firm with normal lochia  Incision clear  Pt points to specific area just at left corner of incision, slightly tender HGB 11 and all other labs WNL.  Evaluated pt with Dr. Royce Macadamia.  I am concerned about the specificity of her pain on the left.  She did have a small window in the left broad ligament that was too close to bladder to close but had no bleeding at all.  Pt has just eaten several packs of crackers and could not go back to OR for 7 hours unless emergent.  Vitals are all stable and pt appears comfortable though rating pain high.  I am going to make her NPO and start the PCA pump to see if this is just incisional pain or if persists.  If the pain does not improve we may need to do an imaging study.

## 2014-07-23 NOTE — Anesthesia Postprocedure Evaluation (Signed)
  Anesthesia Post-op Note  Anesthesia Post Note  Patient: Autumn Mccoy  Procedure(s) Performed: Procedure(s) (LRB): CESAREAN SECTION (N/A)  Anesthesia type: Epidural  Patient location: Mother/Baby  Post pain: Pain level controlled  Post assessment: Post-op Vital signs reviewed  Last Vitals:  Filed Vitals:   07/23/14 0658  BP: 98/73  Pulse: 70  Temp: 36.9 C  Resp: 18    Post vital signs: Reviewed  Level of consciousness:alert  Complications: No apparent anesthesia complications

## 2014-07-23 NOTE — Anesthesia Postprocedure Evaluation (Signed)
  Anesthesia Post-op Note  Patient: Autumn Mccoy  Procedure(s) Performed: Procedure(s): CESAREAN SECTION (N/A)  Patient Location: PACU  Anesthesia Type:Epidural  Level of Consciousness: awake, alert  and oriented  Airway and Oxygen Therapy: Patient Spontanous Breathing  Post-op Pain: none  Post-op Assessment: Post-op Vital signs reviewed, Patient's Cardiovascular Status Stable, Respiratory Function Stable, Patent Airway, No signs of Nausea or vomiting, Pain level controlled, No headache, No backache and No residual numbness  Post-op Vital Signs: Reviewed and stable  Last Vitals:  Filed Vitals:   07/23/14 0045  BP: 106/63  Pulse: 76  Temp:   Resp: 17    Complications: No apparent anesthesia complications

## 2014-07-24 LAB — COMPREHENSIVE METABOLIC PANEL
ALK PHOS: 88 U/L (ref 39–117)
ALT: 11 U/L (ref 0–35)
AST: 26 U/L (ref 0–37)
Albumin: 2 g/dL — ABNORMAL LOW (ref 3.5–5.2)
Anion gap: 10 (ref 5–15)
BUN: 7 mg/dL (ref 6–23)
CHLORIDE: 102 meq/L (ref 96–112)
CO2: 27 meq/L (ref 19–32)
Calcium: 8.7 mg/dL (ref 8.4–10.5)
Creatinine, Ser: 0.79 mg/dL (ref 0.50–1.10)
GFR calc Af Amer: >90 mL/min (ref >90)
Glucose, Bld: 75 mg/dL (ref 70–99)
POTASSIUM: 4.4 meq/L (ref 3.7–5.3)
SODIUM: 139 meq/L (ref 137–147)
Total Bilirubin: 0.2 mg/dL — ABNORMAL LOW (ref 0.3–1.2)
Total Protein: 5 g/dL — ABNORMAL LOW (ref 6.0–8.3)

## 2014-07-24 LAB — CBC WITH DIFFERENTIAL/PLATELET
Basophils Absolute: 0 10*3/uL (ref 0.0–0.1)
Basophils Relative: 0 % (ref 0–1)
Eosinophils Absolute: 0.2 10*3/uL (ref 0.0–0.7)
Eosinophils Relative: 1 % (ref 0–5)
HCT: 29.6 % — ABNORMAL LOW (ref 36.0–46.0)
Hemoglobin: 10.1 g/dL — ABNORMAL LOW (ref 12.0–15.0)
LYMPHS ABS: 1.1 10*3/uL (ref 0.7–4.0)
Lymphocytes Relative: 10 % — ABNORMAL LOW (ref 12–46)
MCH: 31.8 pg (ref 26.0–34.0)
MCHC: 34.1 g/dL (ref 30.0–36.0)
MCV: 93.1 fL (ref 78.0–100.0)
Monocytes Absolute: 0.7 10*3/uL (ref 0.1–1.0)
Monocytes Relative: 6 % (ref 3–12)
NEUTROS ABS: 8.8 10*3/uL — AB (ref 1.7–7.7)
NEUTROS PCT: 83 % — AB (ref 43–77)
PLATELETS: 145 10*3/uL — AB (ref 150–400)
RBC: 3.18 MIL/uL — AB (ref 3.87–5.11)
RDW: 13 % (ref 11.5–15.5)
WBC: 10.7 10*3/uL — AB (ref 4.0–10.5)

## 2014-07-24 NOTE — Progress Notes (Signed)
Patient ID: Autumn Mccoy, female   DOB: 07-31-1986, 28 y.o.   MRN: [id number] #2 afebrile BP normal Abdomen soft and not tender She is tolerating a regular diet, passing flatus, voiding well and ambulating She claims some Left sided pain but exam is normal.

## 2014-07-24 NOTE — Lactation Note (Signed)
This note was copied from the chart of Autumn Mccoy. Lactation Consultation Note  Patient Name: Autumn Tira Oo [id number] Date: 07/24/2014 Reason for consult: Follow-up assessment Baby has been in a cluster feeding pattern this afternoon. Mom trying to latch baby in cradle position but baby not obtaining good depth. [place] worked with Mom in cross-cradle and football hold to help baby obtain more depth. Baby is eager at the breast. Demonstrated how to bring bottom lip down for wider gape. BF basics reviewed with parents, cluster feeding discussed. Mom has DEBP but has not pumped today since baby is BF frequently. Reviewed lactation brochure, advised of OP services and support group. Encouraged Mom to alternate positions with BF. Demonstrated hand expression, few drops colostrum present. Encouraged Mom to call for assist as needed.   Maternal Data    Feeding Feeding Type: Breast Fed Length of feed: 30 min  LATCH Score/Interventions Latch: Grasps breast easily, tongue down, lips flanged, rhythmical sucking. Intervention(s): Adjust position;Assist with latch;Breast massage;Breast compression  Audible Swallowing: A few with stimulation  Type of Nipple: Everted at rest and after stimulation (small positional stripe left nipple)  Comfort (Breast/Nipple): Soft / non-tender     Hold (Positioning): Assistance needed to correctly position infant at breast and maintain latch. Intervention(s): Breastfeeding basics reviewed;Support Pillows;Position options;Skin to skin  LATCH Score: 8  Lactation Tools Discussed/Used Tools: Pump Breast pump type: Double-Electric Breast Pump WIC Program: Yes   Consult Status Consult Status: Follow-up Date: 07/25/14 Follow-up type: In-patient    Katrine Coho 07/24/2014, 6:00 PM

## 2014-07-25 ENCOUNTER — Ambulatory Visit: Payer: Self-pay

## 2014-07-25 MED ORDER — OXYCODONE-ACETAMINOPHEN 5-325 MG PO TABS: 1.0000 | ORAL_TABLET | Freq: Four times a day (QID) | ORAL | Status: DC | PRN

## 2014-07-25 MED ORDER — IBUPROFEN 600 MG PO TABS: 600.0000 mg | ORAL_TABLET | Freq: Four times a day (QID) | ORAL | Status: AC | PRN

## 2014-07-25 NOTE — Lactation Note (Signed)
This note was copied from the chart of Girl Reshanda Bogdan. Lactation Consultation Note  Patient Name: Girl Taralyn General [id number] Date: 07/25/2014 Reason for consult: Follow-up assessment;Infant weight loss Baby was just coming off the left breast when LC arrived. Mom attempted to latch to right breast but baby was asleep and would not latch. Parents are supplementing with feeding tube and syringe. LC advised due to baby being < 6lbs and 11% weight loss to increase supplements from 10 ml each feeding to 20 ml each feeding. Mom reports she is pumping with some feedings. Mom does not have breast pump for home use. [place] left phone number for Mom to call with next feeding to observe latch, assess for swallows and to assist with supplementing. Plan discussed with parents is for Mom to BF, FOB supplement while Mom post pumps. Mom breast are soft, she does reports breast changes early pregnancy.   Maternal Data    Feeding Feeding Type: Breast Fed Length of feed: 15 min  LATCH Score/Interventions                      Lactation Tools Discussed/Used Tools: Pump Breast pump type: Double-Electric Breast Pump   Consult Status Consult Status: Follow-up Date: 07/25/14 Follow-up type: In-patient    Katrine Coho 07/25/2014, 8:38 PM

## 2014-07-25 NOTE — Discharge Summary (Signed)
NAME:  [place], LAPAGLIA NO.:  [id number]  MEDICAL RECORD NO.:  [id number]  LOCATION:  9102                          FACILITY:  [facility]  PHYSICIAN:  Lucille Passy. Ulanda Edison, M.D. DATE OF BIRTH:  [birth year]  DATE OF ADMISSION:  07/22/2014 DATE OF DISCHARGE:                              DISCHARGE SUMMARY   This is a 28 year old white female, para 0, gravida 1, presented for induction of labor given term status and favorable cervix.  Prenatal care was complicated by gestational diabetes mellitus diet controlled and blood sugar is normal.  The patient has a history of a brain tumor at age 36, required surgery, radiation, and chemotherapy, but has been cleared of the disease.  She also has rheumatoid arthritis and was on Humira prior to pregnancy and stopped it.  She was group B strep positive.  Blood group and type A positive, negative antibody, rubella immune, RPR nonreactive, hepatitis B surface antigen negative, HIV negative, group B strep positive.  Cystic fibrosis negative.  First trimester screen and AFP negative.  One-hour Glucola 144.  3 hour GTT was abnormal.  At 9:35 a.m. on 10/15, the patient was 3 cm, 70%, vertex at a -2.  Artificial rupture of the membranes produced slightly blood- tinged fluid, but no meconium.  She received an epidural.  By 1:09 p.m., she was 5 cm.  Pressure catheter was placed.  By 6:17 p.m., she was 9+ cm.  By 8:17 p.m., the cervix was reducible, but the vertex was still OP, she began having repetitive variable decelerations with pushing and slow recovery.  Pitocin was discontinued and restarted.  At 10:21 p.m., cervix was still present.  Dr. Marvel Plan recommended a C-section.  She took the patient to the OR, where she did a low-transverse cervical C- section with delivery of a living infant.  Uterus was closed in 2 layers.  Tubes and ovaries were normal.  Postpartum, the patient complained of pain in the left side that started in the PACU.  She  rated the pain as 8/10, was resting comfortably in bed.  The patient appeared comfortable but rating the pain high.  Dr. Marvel Plan made her n.p.o. and subsequently the patient was allowed to have a diet.  On July 23, 2014, she was having no significant pain.  Her urine was yellow and clear.  Abdomen was soft.  On July 24, 2014, her abdomen remained soft, tolerated a regular diet, passed flatus, voided well, and was ambulating.  On the third postop day, she claimed that her pain was 10/10, although she was resting comfortably in bed without any apparent pain.  Her abdomen was soft.  No masses were felt and she was considered a candidate for discharge.  The 1st postop day, her hemoglobin was 11, hematocrit 31, white count 15,100, platelet count 135,000.  SGOT and PT were 30 and 11.  Glomerular filtration rate greater than 90.  BUN 5, creatinine 0.67.  On the second postop day, BUN was 7, creatinine 0.79. SGOT and PT were normal.  Hemoglobin 10.1, hematocrit 29.6.  At the present time, ti seems like the patient is ready for discharge.  I have instructed her to call if she has  any significant worsening of the pain and we will proceed with further evaluation.  The patient is somewhat hard to evaluate because of normal labs, normal function of her bowel and urine, soft, nontender abdomen, so no further tests have been recommended at this time.  FINAL DIAGNOSES: 1. Intrauterine pregnancy at term with arrest of dilatation. 2. Gestational diabetes mellitus. 3. Postoperative pain.  OPERATION:  Low-transverse cervical C-section.  FINAL CONDITION:  Improved.  INSTRUCTIONS:  Include our regular discharge instruction booklet as well as the after visit summary.  Prescriptions for Percocet 5/325 and Motrin 600 mg, 30 tablets of each, one of each every 6 hours as needed for pain.  Return to the office in 2 weeks for followup examination, but call earlier if she has any significant problems.   The patient is also advised to resume her prenatal vitamins.     Lucille Passy. Ulanda Edison, M.D.     TFH/MEDQ  D:  07/25/2014  T:  07/25/2014  Job:  [id number]

## 2014-07-25 NOTE — Discharge Instructions (Signed)
booklet °

## 2014-07-25 NOTE — Progress Notes (Signed)
Patient ID: Autumn Mccoy, female   DOB: 1986-04-08, 28 y.o.   MRN: [id number] #3 afebrile Labs normal Continues to c/o pain at 10/10 in left side but abdomen is soft and pt appears to be in no pain

## 2014-07-25 NOTE — Lactation Note (Addendum)
This note was copied from the chart of Autumn Mccoy. Lactation Consultation Note  Patient Name: Autumn Mccoy XIPJA'S Date: 07/25/2014 Reason for consult: Follow-up assessment;Infant weight loss;Infant < 6lbs Mom called for LC to observe feeding. She had just latched baby to right breast when [place] arrived. Baby sleepy, demonstrating occasional non-nutritive suckling. Baby did not respond to stimulation. LC had Mom de-latch baby so we could undress baby to wake her up. Mom had difficulty getting baby re-latched. Encouraged Mom to try other positions, she was reluctant but then agreed to try football hold. Mom was able to independently latch baby in football hold to right breast. Baby was sleepy and would only suckle off/on. Re-dressed baby and baby woke up. Mom declined to re-latch at that point. Mom giving supplement via curved tipped syringe. LC has encouraged Mom to increase supplements to 20-30 ml of EBM or formula due to weight loss, baby has had 2 voids in 24 hours (8 voids in her life), no stool in greater than 24 hours with 5 stools in life. It took Mom few tries to get finger feeding to work well with baby, LC did not feel baby was getting benefit of full supplement as a lot was coming out of baby's mouth.  LC discussed with Mom possibly using a bottle to supplement due to increase in amounts and energy expenditure for both Mom and baby. Mom teary and became upset. Mom reports concern that bottle will interfere with BF. LC advised that sometimes bottle are needed when larger amounts are needed to supplement, Mom is trying to recover from surgery, needs to pump to bring milk in w/breastfeeding and is tired. FOB had to work Midwife. Baby is small and needs to feed with the least amount of calorie usage. LC praised Mom for doing a good job. LC discussed using an SNS to supplement at the breast or trying syringe with feeding tube/finger feeding. Mom declined. Mom reports she will try bottle with  next feeding. Plan at this time is that Mom will BF for 15 minutes each breast each feeding, give supplement of 20-30 ml of EBM/formula, then post pump for 15 minutes. Mom reports last pumping was yesterday, she has not received any EBM yet. Encouraged Mom to ask for assist as needed. LC does feel Mom becomes nervous with being observed and this may have affected her feedings skills this visit.   Maternal Data    Feeding Feeding Type: Formula Length of feed: 10 min (off and on)  LATCH Score/Interventions Latch: Grasps breast easily, tongue down, lips flanged, rhythmical sucking. Intervention(s): Adjust position;Assist with latch  Audible Swallowing: None  Type of Nipple: Everted at rest and after stimulation  Comfort (Breast/Nipple): Soft / non-tender  Interventions (Mild/moderate discomfort): Post-pump  Hold (Positioning): Assistance needed to correctly position infant at breast and maintain latch. Intervention(s): Breastfeeding basics reviewed;Support Pillows;Position options;Skin to skin  LATCH Score: 7  Lactation Tools Discussed/Used Tools: Pump Breast pump type: Double-Electric Breast Pump   Consult Status Consult Status: Follow-up Date: 07/26/14 Follow-up type: In-patient    Katrine Coho 07/25/2014, 11:59 PM

## 2014-07-25 NOTE — Plan of Care (Signed)
Problem: Discharge Progression Outcomes Goal: Pain controlled with appropriate interventions Pt given heating pad for pain.

## 2014-07-26 ENCOUNTER — Encounter (HOSPITAL_COMMUNITY): Payer: Self-pay | Admitting: Obstetrics and Gynecology

## 2014-07-26 ENCOUNTER — Ambulatory Visit: Payer: Self-pay

## 2014-07-26 NOTE — Lactation Note (Signed)
This note was copied from the chart of Autumn Pebbles Hemann. Lactation Consultation Note  Patient Name: Autumn Mccoy [id number] Date: 07/26/2014  Spoke with DR.Reitnauer and residents  regarding the need for this baby to stay to meet consistent breast feeding and supplementing goals this am. Has been reported to this [place] mom has been very tired , and teary last evening, nights and this am .  Mom already has a LC plan form last evening. This LC felt the supplementing needed to be more consistent , voids, stools, and consistent pumping  before baby could be discharged home. MBU RN Al Corpus , aware of the plan and needs for this baby and has worked with mom, Supplementing this  am has increased to 22 ml and mom has pumped this am without yield. Also this LC felt the mom is need of some rest , and that the [facility] would visit later today And review LC plan of care. 3-11p LC aware of plan.      Maternal Data    Feeding    [facility] Score/Interventions                      Lactation Tools Discussed/Used     Consult Status      Myer Haff 07/26/2014, 5:21 PM

## 2014-07-26 NOTE — Lactation Note (Signed)
This note was copied from the chart of Autumn Zafiro Colonna. Lactation Consultation Note Follow up visit at 77 hours of age.  Mom reports sleeping during the day and family helped her bottle feed the baby therefore she did not latch and didn't pump for several hours.  Mom reports just finishing pumping for 15 minutes and tried hand expression.  Mom did not collect any colostrum.  Offered to assist with technique for hand expression and mom declines.  MBU RN report includes that mom has had a brain tumor and questioned if mom may not produce milk due to that.  Further assessment may be indicated regarding lactogenesis, but mom is somewhat cognitively impaired and family members are not present at this time.  Suggestion maybe to follow up outpt if needed if mom is not advancing to lactogenesis II.  Mom plans to breast, bottle and pump through the night.  Encouraged mom to wake baby as needed for feedings and offer supplementation as much as baby wants according to feeding guidelines of 91 hours.  Encouragement and support provided with questions answered.      Patient Name: Autumn Mccoy [id number] Date: 07/26/2014 Reason for consult: Follow-up assessment   Maternal Data    Feeding Feeding Type: Bottle Fed - Formula Nipple Type: Slow - flow  LATCH Score/Interventions                Intervention(s): Breastfeeding basics reviewed     Lactation Tools Discussed/Used     Consult Status Consult Status: Follow-up Date: 07/27/14 Follow-up type: In-patient    Justice Britain 07/26/2014, 6:23 PM

## 2014-07-27 ENCOUNTER — Ambulatory Visit: Payer: Self-pay

## 2014-07-27 NOTE — Lactation Note (Signed)
This note was copied from the chart of Autumn Calypso Couillard. Lactation Consultation Note  Patient Name: Autumn Mccoy [id number] Date: 07/27/2014 Reason for consult: Follow-up assessment;Infant weight loss;Infant < 6lbs Maternal Grandparents are here to take Mom/Baby home. MGM is L&D RN here and Mom/Baby will be staying with Maternal Grandparents for the next few days. 2 week pump rental completed per Mom's request while she awaits her breast pump from the insurance company. LC reviewed how to set up pump, clean parts and milk storage. Discussed feeding plan with Mother and Maternal Grandparents. Mom breasts do feel more firm than Sunday, scant leakage noted from right breast after nursing, she is not obtaining EBM with pumping but she is not pumping consistently at this time. Plan discussed was Mom to BF with feeding ques, at least every 3 hours 15 minutes each breast. Family members to give supplement while Mom post pumps to encourage milk production. To post pump for 15-20 minutes.  Advised to increase supplement to 40-45 ml with each feeding since baby is now 63 days old. Engorgement care discussed if needed. Advised to purchase Dr. Owens Shark bottle with preemie nipple #1 for supplementing to be more like Mother's breast. Peds f/u scheduled for Thursday, per Mom. Lactation OP f/u scheduled for Friday, 07/30/14 at 2:30pm.   Maternal Data    Feeding Feeding Type: Bottle Fed - Formula Nipple Type: Slow - flow  LATCH Score/Interventions                      Lactation Tools Discussed/Used Tools: Pump Breast pump type: Double-Electric Breast Pump   Consult Status Consult Status: Complete Date: 07/27/14 Follow-up type: In-patient    Katrine Coho 07/27/2014, 1:51 PM

## 2014-07-27 NOTE — Lactation Note (Signed)
This note was copied from the chart of Autumn Mccoy. Lactation Consultation Note  Patient Name: Autumn Mccoy [id number] Date: 07/27/2014 Reason for consult: Follow-up assessment;Infant < 6lbs Mom had just latched baby to right breast when [place] arrived. Baby sleepy at the breast, nutritive and non-nutritive suckling observed, no audible swallows. After 10 minutes, Mom changed to left breast, taking few minutes to get baby latched. Mom reluctant to accept much help. Drop of breast milk observed from right breast after nursing. Mom's breast do feel like they may be filling, Mom reports they are starting to feel more firm. Mom could not tell [place] when the last time she pumped, but does report not receiving any breast milk. She is supplementing with formula via bottle. Mom does not have DEBP for home use and LC unsure what Mom's plan is at this time. Mom reports she is going home with her Mom and Dad for a few days. Parents are coming in this am. LC left phone number for Mom to call when family arrive to discuss pumping needs and to establish feeding plan for home. Encouraged OP f/u, Mom declined at this time.   Maternal Data    Feeding Feeding Type: Breast Fed  LATCH Score/Interventions Latch: Repeated attempts needed to sustain latch, nipple held in mouth throughout feeding, stimulation needed to elicit sucking reflex.  Audible Swallowing: None  Type of Nipple: Everted at rest and after stimulation  Comfort (Breast/Nipple): Filling, red/small blisters or bruises, mild/mod discomfort  Problem noted: Filling Interventions (Mild/moderate discomfort): Post-pump  Hold (Positioning): No assistance needed to correctly position infant at breast.  LATCH Score: 6  Lactation Tools Discussed/Used     Consult Status Consult Status: Follow-up Date: 07/27/14 Follow-up type: In-patient    Katrine Coho 07/27/2014, 9:09 AM

## 2014-07-28 ENCOUNTER — Inpatient Hospital Stay (HOSPITAL_COMMUNITY): Admission: RE | Admit: 2014-07-28 | Payer: BC Managed Care – PPO | Source: Ambulatory Visit

## 2014-08-09 ENCOUNTER — Encounter (HOSPITAL_COMMUNITY): Payer: Self-pay | Admitting: Obstetrics and Gynecology

## 2015-06-08 ENCOUNTER — Ambulatory Visit (INDEPENDENT_AMBULATORY_CARE_PROVIDER_SITE_OTHER): Payer: No Typology Code available for payment source | Admitting: Family Medicine

## 2015-06-08 ENCOUNTER — Encounter: Payer: Self-pay | Admitting: Family Medicine

## 2015-06-08 VITALS — BP 108/76 | HR 68 | Wt 115.0 lb

## 2015-06-08 DIAGNOSIS — M9902 Segmental and somatic dysfunction of thoracic region: Secondary | ICD-10-CM

## 2015-06-08 DIAGNOSIS — M999 Biomechanical lesion, unspecified: Secondary | ICD-10-CM

## 2015-06-08 DIAGNOSIS — M9901 Segmental and somatic dysfunction of cervical region: Secondary | ICD-10-CM | POA: Diagnosis not present

## 2015-06-08 DIAGNOSIS — M9903 Segmental and somatic dysfunction of lumbar region: Secondary | ICD-10-CM | POA: Diagnosis not present

## 2015-06-08 DIAGNOSIS — M549 Dorsalgia, unspecified: Secondary | ICD-10-CM | POA: Diagnosis not present

## 2015-06-08 MED ORDER — MELOXICAM 15 MG PO TABS: 15.0000 mg | ORAL_TABLET | Freq: Every day | ORAL | Status: DC

## 2015-06-08 MED ORDER — CYCLOBENZAPRINE HCL 5 MG PO TABS: 5.0000 mg | ORAL_TABLET | Freq: Three times a day (TID) | ORAL | Status: DC | PRN

## 2015-06-08 NOTE — Assessment & Plan Note (Addendum)
Patient does have back pain. I do think that this and multifactorial. Patient has had a long history of different medical problems. Patient's was given exercises today, anti-implant for his and muscle relaxers. We discussed what activities to do and which ones to avoid. Patient attempted to respond to osteopathic manipulation today. Patient was given work on Engineer, building services. We discussed proper lifting technique. Patient come back and see me again in 3-4 weeks for further evaluation and treatment.

## 2015-06-08 NOTE — Progress Notes (Signed)
[facility] [address] [place], [place] [address] Phone: [phone] Subjective:     CC: Back pain  [id number] Autumn Mccoy is a 29 y.o. female coming in with complaint of back pain. States that seems to be generalized from her neck all way down to the lower back. Seems to be intermittent and worse with increasing activity. When she is on her feet for long amount of time it can be very difficult. Patient states that certain range of motion activity seem to be hard. 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 Patient states after doing a lot of this it can be painful. Has been given pain medications in the past that has been helpful. Denies any radiation down the legs or any numbness or tingling. States that sitting for long amount of time can be difficult. States that it's difficult to follow sleep at night. Rates the severity of pain a 7 out of 10.  Past Medical History  Diagnosis Date  . Malignant brain tumor     age 56 treated with chemo and rad  . Gestational diabetes     diet controlled  . Hx of varicella    Past Surgical History  Procedure Laterality Date  . Brain surgery  1995  . Cesarean section N/A 07/22/2014    Procedure: CESAREAN SECTION;  Surgeon: Logan Bores, MD;  Location: [facility];  Service: Obstetrics;  Laterality: N/A;   Social History  Substance Use Topics  . Smoking status: Never Smoker   . Smokeless tobacco: Never Used  . Alcohol Use: No   No Known Allergies History reviewed. No pertinent family history.      Past medical history, social, surgical and family history all reviewed in electronic medical record.   Review of Systems: No headache, visual changes, nausea, vomiting, diarrhea, constipation, dizziness, abdominal pain, skin rash, fevers, chills, night sweats, weight loss, swollen lymph nodes, body aches, joint swelling, muscle aches, chest pain, shortness of  breath, mood changes.   Objective Blood pressure 108/76, pulse 68, weight 115 lb (52.164 kg), unknown if currently breastfeeding.  General: No apparent distress alert and oriented x3 mood and affect normal, dressed appropriately.  HEENT: Pupils equal, extraocular movements intact  Respiratory: Patient's speak in full sentences and does not appear short of breath  Cardiovascular: No lower extremity edema, non tender, no erythema  Skin: Warm dry intact with no signs of infection or rash on extremities or on axial skeleton.  Abdomen: Soft nontender  Neuro: Cranial nerves II through XII are intact, neurovascularly intact in all extremities with 2+ DTRs and 2+ pulses.  Lymph: No lymphadenopathy of posterior or anterior cervical chain or axillae bilaterally.  Gait normal with good balance and coordination.  MSK:  Non tender with full range of motion and good stability and symmetric strength and tone of shoulders, elbows, wrist, hip, knee and ankles bilaterally.  Neck: Inspection unremarkable. Mild loss of lordosis Negative Spurling's maneuver. Full neck range of motion Grip strength and sensation normal in bilateral hands Strength good C4 to T1 distribution No sensory change to C4 to T1 Negative Hoffman sign bilaterally Reflexes normal Back Exam:  Inspection: Unremarkable  Motion: Flexion 45 deg, Extension 45 deg, Side Bending to 45 deg bilaterally,  Rotation to 45 deg bilaterally  SLR laying: Negative  XSLR laying: Negative  Palpable tenderness: Diffuse tenderness of the Pierce spinal musculature mostly of the lumbar and cervical  FABER: Negative patella bilaterally Sensory change: Gross sensation intact to all lumbar and sacral dermatomes.  Reflexes: 2+ at both patellar tendons, 2+ at achilles tendons, Babinski's downgoing.  Strength at foot  Plantar-flexion: 5/5 Dorsi-flexion: 5/5 Eversion: 5/5 Inversion: 5/5  Leg strength  Quad: 5/5 Hamstring: 5/5 Hip flexor: 5/5 Hip abductors: 5/5    Gait unremarkable.  Osteopathic findings C2 flexed rotated and side bent left C5 flexed rotated and side bent right T3 extended rotated and side bent right L2 flexed rotated and side bent left Sacrum left on left  Procedure note 97110; 15 minutes spent for Therapeutic exercises as stated in above notes.  This included exercises focusing on stretching, strengthening, with significant focus on eccentric aspects. Low back exercises that included:  Pelvic tilt/bracing instruction to focus on control of the pelvic girdle and lower abdominal muscles  Glute strengthening exercises, focusing on proper firing of the glutes without engaging the low back muscles Proper stretching techniques for maximum relief for the hamstrings, hip flexors, low back and some rotation where tolerated  Proper technique shown and discussed handout in great detail with ATC.  All questions were discussed and answered.      Impression and Recommendations:     This case required medical decision making of moderate complexity.

## 2015-06-08 NOTE — Patient Instructions (Addendum)
Good to see you.  Ice 20 minutes 2 times daily. Usually after activity and before bed. Exercises 3 times a week.  meloxicam daily for 10 days then as needed Flexeril at night if needed. May start with half a tab.  Work on posture. Work on proper lifting.  See me again in 4 weeks.

## 2015-06-09 DIAGNOSIS — M999 Biomechanical lesion, unspecified: Secondary | ICD-10-CM | POA: Insufficient documentation

## 2015-06-09 NOTE — Assessment & Plan Note (Signed)
Decision today to treat with OMT was based on Physical Exam  After verbal consent patient was treated with ME, FPR, ST techniques in cervical, thoracic, lumbar and sacral areas  Patient tolerated the procedure well with improvement in symptoms  Patient given exercises, stretches and lifestyle modifications  See medications in patient instructions if given  Patient will follow up in 3-4 weeks

## 2015-06-10 ENCOUNTER — Telehealth: Payer: Self-pay | Admitting: *Deleted

## 2015-06-10 NOTE — Telephone Encounter (Signed)
Left msg on triage requesting call back concerning medications. Called pt back no answer LMOM RTC...Johny Chess

## 2015-07-06 ENCOUNTER — Encounter: Payer: Self-pay | Admitting: Family Medicine

## 2015-07-06 ENCOUNTER — Ambulatory Visit (INDEPENDENT_AMBULATORY_CARE_PROVIDER_SITE_OTHER): Payer: No Typology Code available for payment source | Admitting: Family Medicine

## 2015-07-06 VITALS — BP 112/76 | HR 78 | Wt 115.0 lb

## 2015-07-06 DIAGNOSIS — M999 Biomechanical lesion, unspecified: Secondary | ICD-10-CM

## 2015-07-06 DIAGNOSIS — M9902 Segmental and somatic dysfunction of thoracic region: Secondary | ICD-10-CM | POA: Diagnosis not present

## 2015-07-06 DIAGNOSIS — M5380 Other specified dorsopathies, site unspecified: Secondary | ICD-10-CM | POA: Diagnosis not present

## 2015-07-06 DIAGNOSIS — M9903 Segmental and somatic dysfunction of lumbar region: Secondary | ICD-10-CM | POA: Diagnosis not present

## 2015-07-06 DIAGNOSIS — M9901 Segmental and somatic dysfunction of cervical region: Secondary | ICD-10-CM

## 2015-07-06 NOTE — Patient Instructions (Addendum)
Good to see you Ice can still be your friend.  Stay active and continue the exercises Use the medicines when you need them . I am impressed, it will take time but you are doing better See me again in 4 weeks.

## 2015-07-06 NOTE — Assessment & Plan Note (Signed)
Decision today to treat with OMT was based on Physical Exam  After verbal consent patient was treated with ME, FPR, ST techniques in cervical, thoracic, lumbar and sacral areas  Patient tolerated the procedure well with improvement in symptoms  Patient given exercises, stretches and lifestyle modifications  See medications in patient instructions if given  Patient will follow up in 4 weeks

## 2015-07-06 NOTE — Progress Notes (Signed)
Pre visit review using our clinic review tool, if applicable. No additional management support is needed unless otherwise documented below in the visit note. 

## 2015-07-06 NOTE — Progress Notes (Signed)
[facility] [address] [place], [place] [address] Phone: [phone] Subjective:     CC: Back pain follow up  [id number] Autumn Mccoy is a 29 y.o. female coming in with complaint of back pain.  Patient was seen previously and was diagnosed with more of a muscle imbalances. We discussed with patient at great length and was given home exercises and icing protocol.patient was given meloxicam as well as Flexeril. Patient states she is improving somewhat. Noticed not as tight. Still worse when she does activity in a flexed position for a long amount of time. Denies any radiation of pain to any extremity. Not stopping her from any daily activities.  Past Medical History  Diagnosis Date  . Malignant brain tumor     age 44 treated with chemo and rad  . Gestational diabetes     diet controlled  . Hx of varicella    Past Surgical History  Procedure Laterality Date  . Brain surgery  1995  . Cesarean section N/A 07/22/2014    Procedure: CESAREAN SECTION;  Surgeon: Logan Bores, MD;  Location: [facility];  Service: Obstetrics;  Laterality: N/A;   Social History  Substance Use Topics  . Smoking status: Never Smoker   . Smokeless tobacco: Never Used  . Alcohol Use: No   No Known Allergies No family history on file.      Past medical history, social, surgical and family history all reviewed in electronic medical record.   Review of Systems: No headache, visual changes, nausea, vomiting, diarrhea, constipation, dizziness, abdominal pain, skin rash, fevers, chills, night sweats, weight loss, swollen lymph nodes, body aches, joint swelling, muscle aches, chest pain, shortness of breath, mood changes.   Objective Blood pressure 112/76, pulse 78, weight 115 lb (52.164 kg), SpO2 95 %, unknown if currently breastfeeding.  General: No apparent distress alert and oriented x3 mood and affect normal, dressed appropriately.  HEENT: Pupils equal, extraocular  movements intact  Respiratory: Patient's speak in full sentences and does not appear short of breath  Cardiovascular: No lower extremity edema, non tender, no erythema  Skin: Warm dry intact with no signs of infection or rash on extremities or on axial skeleton.  Abdomen: Soft nontender  Neuro: Cranial nerves II through XII are intact, neurovascularly intact in all extremities with 2+ DTRs and 2+ pulses.  Lymph: No lymphadenopathy of posterior or anterior cervical chain or axillae bilaterally.  Gait normal with good balance and coordination.  MSK:  Non tender with full range of motion and good stability and symmetric strength and tone of shoulders, elbows, wrist, hip, knee and ankles bilaterally.  Neck: Inspection unremarkable. Mild loss of lordosis Negative Spurling's maneuver. Full neck range of motion Grip strength and sensation normal in bilateral hands Strength good C4 to T1 distribution No sensory change to C4 to T1 Negative Hoffman sign bilaterally Reflexes normal Back Exam:  Inspection: Unremarkable  Motion: Flexion 45 deg, Extension 45 deg, Side Bending to 45 deg bilaterally,  Rotation to 45 deg bilaterally  SLR laying: Negative  XSLR laying: Negative  Palpable tenderness: continued tenderness of the paraspinal musculature but less than previous exam FABER: Negative patella bilaterally Sensory change: Gross sensation intact to all lumbar and sacral dermatomes.  Reflexes: 2+ at both patellar tendons, 2+ at achilles tendons, Babinski's downgoing.  Strength at foot  Plantar-flexion: 5/5 Dorsi-flexion: 5/5 Eversion: 5/5 Inversion: 5/5  Leg strength  Quad: 5/5 Hamstring: 5/5 Hip flexor: 5/5 Hip abductors: 5/5  Gait  unremarkable.  Osteopathic findings C2 flexed rotated and side bent left C5 flexed rotated and side bent right T3 extended rotated and side bent right T7 extended rotated and side bent right L2 flexed rotated and side bent left L3 flexed rotated and side bent  right Sacrum left on left       Impression and Recommendations:     This case required medical decision making of moderate complexity.

## 2015-07-06 NOTE — Assessment & Plan Note (Signed)
Patient's muscle tightness seems to be more secondary to muscle imbalances. Patient is doing very well with conservative therapy and has medicines for any breakthrough pain. We discussed postural exercises that I think will also be beneficial. Patient continue the range of motion and continued on core strength. Patient then will come back and see me again in 4 weeks for further evaluation. Continues respond well to osteopathic manipulation.

## 2015-07-15 ENCOUNTER — Other Ambulatory Visit: Payer: Self-pay | Admitting: Family Medicine

## 2015-07-15 NOTE — Telephone Encounter (Signed)
Refill done.  

## 2015-08-03 ENCOUNTER — Ambulatory Visit: Payer: No Typology Code available for payment source | Admitting: Family Medicine

## 2015-08-05 ENCOUNTER — Ambulatory Visit (INDEPENDENT_AMBULATORY_CARE_PROVIDER_SITE_OTHER): Payer: No Typology Code available for payment source | Admitting: Family Medicine

## 2015-08-05 ENCOUNTER — Encounter: Payer: Self-pay | Admitting: Family Medicine

## 2015-08-05 VITALS — BP 118/70 | HR 72 | Wt 118.0 lb

## 2015-08-05 DIAGNOSIS — M9901 Segmental and somatic dysfunction of cervical region: Secondary | ICD-10-CM | POA: Diagnosis not present

## 2015-08-05 DIAGNOSIS — M9903 Segmental and somatic dysfunction of lumbar region: Secondary | ICD-10-CM

## 2015-08-05 DIAGNOSIS — M5380 Other specified dorsopathies, site unspecified: Secondary | ICD-10-CM

## 2015-08-05 DIAGNOSIS — M999 Biomechanical lesion, unspecified: Secondary | ICD-10-CM

## 2015-08-05 DIAGNOSIS — M9902 Segmental and somatic dysfunction of thoracic region: Secondary | ICD-10-CM | POA: Diagnosis not present

## 2015-08-05 NOTE — Assessment & Plan Note (Signed)
Patient is doing better. Discussed the importance of making the exercises more for routine. Encourage her to continue with the natural supplementations. Has not needed muscle relaxer.  No changes at this time.

## 2015-08-05 NOTE — Progress Notes (Signed)
Pre visit review using our clinic review tool, if applicable. No additional management support is needed unless otherwise documented below in the visit note. 

## 2015-08-05 NOTE — Assessment & Plan Note (Addendum)
Decision today to treat with OMT was based on Physical Exam  After verbal consent patient was treated with ME, FPR, ST techniques in cervical, thoracic, lumbar and sacral areas  Patient tolerated the procedure well with improvement in symptoms  Patient given exercises, stretches and lifestyle modifications  See medications in patient instructions if given  Patient will follow up in 6 weeks    

## 2015-08-05 NOTE — Progress Notes (Signed)
[facility] [address] [place], [place] [address] Phone: [phone] Subjective:     CC: Back pain follow up  [id number] Autumn Mccoy is a 29 y.o. female coming in with complaint of back pain.  Patient was seen previously and was diagnosed with more of a muscle imbalances. Patient was cautiously optimistic at last exam. Hasn't responded very well to the conservative therapy. Patient states not doing the exercises regularly.  Still mild pain but moving in the right direction/.  More active less fatigue.  Would say another 15% better   Past Medical History  Diagnosis Date  . Malignant brain tumor [facility])     age 61 treated with chemo and rad  . Gestational diabetes     diet controlled  . Hx of varicella    Past Surgical History  Procedure Laterality Date  . Brain surgery  1995  . Cesarean section N/A 07/22/2014    Procedure: CESAREAN SECTION;  Surgeon: Logan Bores, MD;  Location: [facility];  Service: Obstetrics;  Laterality: N/A;   Social History  Substance Use Topics  . Smoking status: Never Smoker   . Smokeless tobacco: Never Used  . Alcohol Use: No   No Known Allergies No family history on file.      Past medical history, social, surgical and family history all reviewed in electronic medical record.   Review of Systems: No headache, visual changes, nausea, vomiting, diarrhea, constipation, dizziness, abdominal pain, skin rash, fevers, chills, night sweats, weight loss, swollen lymph nodes, body aches, joint swelling, muscle aches, chest pain, shortness of breath, mood changes.   Objective Blood pressure 118/70, pulse 72, weight 118 lb (53.524 kg), SpO2 96 %, unknown if currently breastfeeding.  General: No apparent distress alert and oriented x3 mood and affect normal, dressed appropriately.  HEENT: Pupils equal, extraocular movements intact  Respiratory: Patient's speak in full sentences and does not appear short of breath    Cardiovascular: No lower extremity edema, non tender, no erythema  Skin: Warm dry intact with no signs of infection or rash on extremities or on axial skeleton.  Abdomen: Soft nontender  Neuro: Cranial nerves II through XII are intact, neurovascularly intact in all extremities with 2+ DTRs and 2+ pulses.  Lymph: No lymphadenopathy of posterior or anterior cervical chain or axillae bilaterally.  Gait normal with good balance and coordination.  MSK:  Non tender with full range of motion and good stability and symmetric strength and tone of shoulders, elbows, wrist, hip, knee and ankles bilaterally.  Neck: Inspection unremarkable. Mild loss of lordosis Negative Spurling's maneuver. Full neck range of motion Grip strength and sensation normal in bilateral hands Strength good C4 to T1 distribution No sensory change to C4 to T1 Negative Hoffman sign bilaterally Reflexes normal Back Exam:  Inspection: Unremarkable  Motion: Flexion 45 deg, Extension 45 deg, Side Bending to 45 deg bilaterally,  Rotation to 45 deg bilaterally  SLR laying: Negative  XSLR laying: Negative  Palpable tenderness: continued tenderness of the paraspinal musculature but less than previous exam FABER: Negative patella bilaterally Sensory change: Gross sensation intact to all lumbar and sacral dermatomes.  Reflexes: 2+ at both patellar tendons, 2+ at achilles tendons, Babinski's downgoing.  Strength at foot  Plantar-flexion: 5/5 Dorsi-flexion: 5/5 Eversion: 5/5 Inversion: 5/5  Leg strength  Quad: 5/5 Hamstring: 5/5 Hip flexor: 5/5 Hip abductors: 5/5  Gait unremarkable.  Osteopathic findings C2 flexed rotated and side bent left C5 flexed rotated and side bent  right T3 extended rotated and side bent right T7 extended rotated and side bent right L2 flexed rotated and side bent left L3 flexed rotated and side bent right Sacrum left on left     Impression and Recommendations:     This case required medical  decision making of moderate complexity.

## 2015-08-05 NOTE — Patient Instructions (Addendum)
Great to see you! Ice is your friend Continue to stay active Try to make some of the exercises part of your routine.  Once it is habit it will be easier Lets try 5-6 weeks

## 2015-09-12 ENCOUNTER — Other Ambulatory Visit: Payer: Self-pay | Admitting: *Deleted

## 2015-09-12 MED ORDER — MELOXICAM 15 MG PO TABS: 15.0000 mg | ORAL_TABLET | Freq: Every day | ORAL | Status: DC

## 2015-09-12 NOTE — Telephone Encounter (Signed)
Refill done.  

## 2015-09-16 ENCOUNTER — Ambulatory Visit: Payer: No Typology Code available for payment source | Admitting: Family Medicine

## 2015-09-30 ENCOUNTER — Ambulatory Visit: Payer: No Typology Code available for payment source | Admitting: Family Medicine

## 2015-10-20 ENCOUNTER — Encounter: Payer: Self-pay | Admitting: Family Medicine

## 2015-10-20 ENCOUNTER — Ambulatory Visit (INDEPENDENT_AMBULATORY_CARE_PROVIDER_SITE_OTHER): Payer: No Typology Code available for payment source | Admitting: Family Medicine

## 2015-10-20 VITALS — BP 110/68 | HR 81 | Wt 118.0 lb

## 2015-10-20 DIAGNOSIS — M9902 Segmental and somatic dysfunction of thoracic region: Secondary | ICD-10-CM

## 2015-10-20 DIAGNOSIS — M999 Biomechanical lesion, unspecified: Secondary | ICD-10-CM

## 2015-10-20 DIAGNOSIS — M9903 Segmental and somatic dysfunction of lumbar region: Secondary | ICD-10-CM | POA: Diagnosis not present

## 2015-10-20 DIAGNOSIS — M9901 Segmental and somatic dysfunction of cervical region: Secondary | ICD-10-CM | POA: Diagnosis not present

## 2015-10-20 DIAGNOSIS — M5380 Other specified dorsopathies, site unspecified: Secondary | ICD-10-CM

## 2015-10-20 NOTE — Progress Notes (Signed)
[facility] [address] [place], [place] [address] Phone: [phone] Subjective:     CC: Back pain follow up  [id number] Autumn Mccoy is a 30 y.o. female coming in with complaint of back pain.  Patient was seen previously and was diagnosed with more of a muscle imbalances. Patient overall has been doing somewhat better. Patient was doing great and has had an exacerbation over the course last several weeks. She has not been doing exercises regularly. Stop taking the vitamins as well but has started in the last couple days. Maybe some mild improvement. No new symptoms is worsening a previous symptoms.   Past Medical History  Diagnosis Date  . Malignant brain tumor [facility])     age 53 treated with chemo and rad  . Gestational diabetes     diet controlled  . Hx of varicella    Past Surgical History  Procedure Laterality Date  . Brain surgery  1995  . Cesarean section N/A 07/22/2014    Procedure: CESAREAN SECTION;  Surgeon: Logan Bores, MD;  Location: [facility];  Service: Obstetrics;  Laterality: N/A;   Social History  Substance Use Topics  . Smoking status: Never Smoker   . Smokeless tobacco: Never Used  . Alcohol Use: No   No Known Allergies No family history on file. positive history of autoimmune diseases as well as multiple sclerosis      Past medical history, social, surgical and family history all reviewed in electronic medical record.   Review of Systems: No headache, visual changes, nausea, vomiting, diarrhea, constipation, dizziness, abdominal pain, skin rash, fevers, chills, night sweats, weight loss, swollen lymph nodes, body aches, joint swelling, muscle aches, chest pain, shortness of breath, mood changes.   Objective Blood pressure 110/68, pulse 81, weight 118 lb (53.524 kg), SpO2 98 %, unknown if currently breastfeeding.  General: No apparent distress alert and oriented x3 mood and affect normal, dressed appropriately.    HEENT: Pupils equal, extraocular movements intact  Respiratory: Patient's speak in full sentences and does not appear short of breath  Cardiovascular: No lower extremity edema, non tender, no erythema  Skin: Warm dry intact with no signs of infection or rash on extremities or on axial skeleton.  Abdomen: Soft nontender  Neuro: Cranial nerves II through XII are intact, neurovascularly intact in all extremities with 2+ DTRs and 2+ pulses.  Lymph: No lymphadenopathy of posterior or anterior cervical chain or axillae bilaterally.  Gait normal with good balance and coordination.  MSK:  Non tender with full range of motion and good stability and symmetric strength and tone of shoulders, elbows, wrist, hip, knee and ankles bilaterally.  Neck: Inspection unremarkable. Mild loss of lordosis Negative Spurling's maneuver. Full neck range of motion Grip strength and sensation normal in bilateral hands Strength good C4 to T1 distribution No sensory change to C4 to T1 Negative Hoffman sign bilaterally Reflexes normal Back Exam:  Inspection: Unremarkable  Motion: Flexion 45 deg, Extension 25deg, Side Bending to 45 deg bilaterally,  Rotation to 45 deg bilaterally  SLR laying: Negative  XSLR laying: Negative  Palpable tenderness: continued tenderness of the paraspinal musculature worse in the upper thoracic spine FABER: Negative patella bilaterally Sensory change: Gross sensation intact to all lumbar and sacral dermatomes.  Reflexes: 2+ at both patellar tendons, 2+ at achilles tendons, Babinski's downgoing.  Strength at foot  Plantar-flexion: 5/5 Dorsi-flexion: 5/5 Eversion: 5/5 Inversion: 5/5  Leg strength  Quad: 5/5 Hamstring: 5/5 Hip flexor: 5/5  Hip abductors: 5/5  Gait unremarkable.  Osteopathic findings C2 flexed rotated and side bent left C5 flexed rotated and side bent right T3 extended rotated and side bent right T9 extended rotated and side bent right L2 flexed rotated and side bent  left Sacrum left on left     Impression and Recommendations:     This case required medical decision making of moderate complexity.

## 2015-10-20 NOTE — Assessment & Plan Note (Signed)
Had some significant tightness is secondary to muscle imbalances and poor posture. We discussed icing regimen and home exercises. We discussed which activities to do an which was potentially avoid. We discussed trying to bring the shoulder back. We discussed using the muscle relaxer if needed. Patient will do anti-inflammatories as needed as well. Patient given a refill for the prescription. Encourage patient to see me on a more regular basis at 6 week intervals for now.

## 2015-10-20 NOTE — Assessment & Plan Note (Signed)
Decision today to treat with OMT was based on Physical Exam  After verbal consent patient was treated with ME, FPR, ST techniques in cervical, thoracic, lumbar and sacral areas  Patient tolerated the procedure well with improvement in symptoms  Patient given exercises, stretches and lifestyle modifications  See medications in patient instructions if given  Patient will follow up in 6 weeks    

## 2015-10-20 NOTE — Patient Instructions (Signed)
Good to see you Ice is your friend when needed Posture will be key  Try to stay active when you can I would like to see you again in 6 weeks.

## 2015-10-20 NOTE — Progress Notes (Signed)
Pre visit review using our clinic review tool, if applicable. No additional management support is needed unless otherwise documented below in the visit note. 

## 2015-12-01 ENCOUNTER — Ambulatory Visit: Payer: No Typology Code available for payment source | Admitting: Family Medicine

## 2016-01-04 ENCOUNTER — Ambulatory Visit: Payer: No Typology Code available for payment source | Admitting: Family Medicine

## 2016-01-25 ENCOUNTER — Ambulatory Visit (INDEPENDENT_AMBULATORY_CARE_PROVIDER_SITE_OTHER): Payer: No Typology Code available for payment source | Admitting: Family Medicine

## 2016-01-25 ENCOUNTER — Encounter: Payer: Self-pay | Admitting: Family Medicine

## 2016-01-25 VITALS — BP 110/80 | HR 74

## 2016-01-25 DIAGNOSIS — M999 Biomechanical lesion, unspecified: Secondary | ICD-10-CM

## 2016-01-25 DIAGNOSIS — M9902 Segmental and somatic dysfunction of thoracic region: Secondary | ICD-10-CM

## 2016-01-25 DIAGNOSIS — M9901 Segmental and somatic dysfunction of cervical region: Secondary | ICD-10-CM | POA: Diagnosis not present

## 2016-01-25 DIAGNOSIS — M9903 Segmental and somatic dysfunction of lumbar region: Secondary | ICD-10-CM

## 2016-01-25 DIAGNOSIS — M542 Cervicalgia: Secondary | ICD-10-CM

## 2016-01-25 NOTE — Patient Instructions (Signed)
Good to see you.  Overall good Try to do the exercises 2-3 times a week Work on Therapist, nutritional when you need it See me again in 8 weeks Sorry to scare Ruthie

## 2016-01-25 NOTE — Progress Notes (Signed)
Pre visit review using our clinic review tool, if applicable. No additional management support is needed unless otherwise documented below in the visit note. 

## 2016-01-25 NOTE — Assessment & Plan Note (Signed)
Decision today to treat with OMT was based on Physical Exam  After verbal consent patient was treated with ME, FPR, ST techniques in cervical, thoracic, lumbar and sacral areas  Patient tolerated the procedure well with improvement in symptoms  Patient given exercises, stretches and lifestyle modifications  See medications in patient instructions if given  Patient will follow up in 8 weeks

## 2016-01-25 NOTE — Progress Notes (Signed)
[facility] [place] [place], [place] [address] Phone: [phone] Subjective:     CC: Back pain follow up  [id number] Autumn Mccoy is a 30 y.o. female coming in with complaint of back pain.  Patient was seen previously and was diagnosed with more of a muscle imbalances. Continues to have some difficulty. Does think that she is improving. Patient knows if she does he exercises she seems to be doing well. Uses meloxicam very intermittently. No significant worsening symptoms. Overall does think that she is making some improvement.   Past Medical History  Diagnosis Date  . Malignant brain tumor [facility])     age 84 treated with chemo and rad  . Gestational diabetes     diet controlled  . Hx of varicella    Past Surgical History  Procedure Laterality Date  . Brain surgery  1995  . Cesarean section N/A 07/22/2014    Procedure: CESAREAN SECTION;  Surgeon: Logan Bores, MD;  Location: [facility];  Service: Obstetrics;  Laterality: N/A;   Social History  Substance Use Topics  . Smoking status: Never Smoker   . Smokeless tobacco: Never Used  . Alcohol Use: No   No Known Allergies No family history on file. positive history of autoimmune diseases as well as multiple sclerosis    Past medical history, social, surgical and family history all reviewed in electronic medical record.   Review of Systems: No headache, visual changes, nausea, vomiting, diarrhea, constipation, dizziness, abdominal pain, skin rash, fevers, chills, night sweats, weight loss, swollen lymph nodes, body aches, joint swelling, muscle aches, chest pain, shortness of breath, mood changes.   Objective Blood pressure 110/80, pulse 74, SpO2 97 %, unknown if currently breastfeeding.  General: No apparent distress alert and oriented x3 mood and affect normal, dressed appropriately.  HEENT: Pupils equal, extraocular movements intact  Respiratory: Patient's speak in full sentences and  does not appear short of breath  Cardiovascular: No lower extremity edema, non tender, no erythema  Skin: Warm dry intact with no signs of infection or rash on extremities or on axial skeleton.  Abdomen: Soft nontender  Neuro: Cranial nerves II through XII are intact, neurovascularly intact in all extremities with 2+ DTRs and 2+ pulses.  Lymph: No lymphadenopathy of posterior or anterior cervical chain or axillae bilaterally.  Gait normal with good balance and coordination.  MSK:  Non tender with full range of motion and good stability and symmetric strength and tone of shoulders, elbows, wrist, hip, knee and ankles bilaterally.  Neck: Inspection unremarkable. Mild loss of lordosis Negative Spurling's maneuver. Full neck range of motion Grip strength and sensation normal in bilateral hands Strength good C4 to T1 distribution No sensory change to C4 to T1 Negative Hoffman sign bilaterally Reflexes normal Back Exam:  Inspection: Unremarkable  Motion: Flexion 45 deg, Extension 25deg, Side Bending to 45 deg bilaterally,  Rotation to 45 deg bilaterally  SLR laying: Negative  XSLR laying: Negative  Palpable tenderness: continued tenderness of the paraspinal but now more on the cervical spine on the left sign FABER: Negative patella bilaterally Sensory change: Gross sensation intact to all lumbar and sacral dermatomes.  Reflexes: 2+ at both patellar tendons, 2+ at achilles tendons, Babinski's downgoing.  Strength at foot  Plantar-flexion: 5/5 Dorsi-flexion: 5/5 Eversion: 5/5 Inversion: 5/5  Leg strength  Quad: 5/5 Hamstring: 5/5 Hip flexor: 5/5 Hip abductors: 4/5  Gait unremarkable.  Osteopathic findings C2 flexed rotated and side bent left C6 flexed  rotated and side bent right T3 extended rotated and side bent right T7 extended rotated and side bent right L2 flexed rotated and side bent left Sacrum left on left     Impression and Recommendations:     This case required  medical decision making of moderate complexity.

## 2016-01-25 NOTE — Assessment & Plan Note (Signed)
Continues to be more of a tightness. Continues to respond well to conservative therapy. He is making improvement in range time I see her. Still has some limitation in with neck movement. I do not feel anything else that needs further workup with patient's history of cancer at this time. Patient has been asymptomatic in that regard. Encourage her to continue to work on the posture we discussed more of the scapular stabilization again. Patient does have a young 64-month-old and does do a lot of carrying anything that this contributes. Follow-up again in 8 weeks.

## 2016-03-21 ENCOUNTER — Ambulatory Visit: Payer: Self-pay | Admitting: Family Medicine

## 2016-03-21 DIAGNOSIS — Z0289 Encounter for other administrative examinations: Secondary | ICD-10-CM

## 2016-04-16 ENCOUNTER — Ambulatory Visit (INDEPENDENT_AMBULATORY_CARE_PROVIDER_SITE_OTHER): Payer: No Typology Code available for payment source | Admitting: Family Medicine

## 2016-04-16 ENCOUNTER — Encounter: Payer: Self-pay | Admitting: Family Medicine

## 2016-04-16 VITALS — BP 110/70 | HR 84 | Wt 121.0 lb

## 2016-04-16 DIAGNOSIS — M9902 Segmental and somatic dysfunction of thoracic region: Secondary | ICD-10-CM

## 2016-04-16 DIAGNOSIS — M999 Biomechanical lesion, unspecified: Secondary | ICD-10-CM

## 2016-04-16 DIAGNOSIS — M542 Cervicalgia: Secondary | ICD-10-CM

## 2016-04-16 DIAGNOSIS — M9901 Segmental and somatic dysfunction of cervical region: Secondary | ICD-10-CM

## 2016-04-16 DIAGNOSIS — M9903 Segmental and somatic dysfunction of lumbar region: Secondary | ICD-10-CM

## 2016-04-16 DIAGNOSIS — M2550 Pain in unspecified joint: Secondary | ICD-10-CM

## 2016-04-16 NOTE — Assessment & Plan Note (Signed)
Decision today to treat with OMT was based on Physical Exam  After verbal consent patient was treated with ME, FPR, ST techniques in cervical, thoracic, lumbar and sacral areas  Patient tolerated the procedure well with improvement in symptoms  Patient given exercises, stretches and lifestyle modifications  See medications in patient instructions if given  Patient will follow up in 6-8 weeks

## 2016-04-16 NOTE — Assessment & Plan Note (Signed)
Discussed with patient and did not have any lambs that corresponds with a positive ANA and this time. Patient does not have significant signs correspond with rheumatoid arthritis and does not have a strong family history either. Patient though does want referral for the polyarthralgia. Patient will be referred to rheumatology for further workup.

## 2016-04-16 NOTE — Progress Notes (Signed)
Pre visit review using our clinic review tool, if applicable. No additional management support is needed unless otherwise documented below in the visit note. 

## 2016-04-16 NOTE — Progress Notes (Signed)
[facility] [place], [place] [address] Phone: [phone] Subjective:     CC: Back pain follow up  [id number] Autumn Mccoy is a 30 y.o. female coming in with complaint of back pain.  Patient was seen previously and was diagnosed with more of a muscle imbalances. Continues to have some difficulty. Patient states in the past she has been diagnosed with rheumatoid arthritis but had not been seen anyone. Feels that her pain seems to be worsening overall. Patient likely referred to a rheumatologist. Her last one she saw before she is pregnant. Now she has a 43-1/2-year-old. Patient continues to have the multiple joint aches. Back seems to still be worse. Continues respond to osteopathic manipulation.   Past Medical History  Diagnosis Date  . Malignant brain tumor [facility])     age 16 treated with chemo and rad  . Gestational diabetes     diet controlled  . Hx of varicella    Past Surgical History  Procedure Laterality Date  . Brain surgery  1995  . Cesarean section N/A 07/22/2014    Procedure: CESAREAN SECTION;  Surgeon: Logan Bores, MD;  Location: [facility];  Service: Obstetrics;  Laterality: N/A;   Social History  Substance Use Topics  . Smoking status: Never Smoker   . Smokeless tobacco: Never Used  . Alcohol Use: No   No Known Allergies No family history on file. positive history of autoimmune diseases as well as multiple sclerosis    Past medical history, social, surgical and family history all reviewed in electronic medical record.   Review of Systems: No headache, visual changes, nausea, vomiting, diarrhea, constipation, dizziness, abdominal pain, skin rash, fevers, chills, night sweats, weight loss, swollen lymph nodes, body aches, joint swelling, muscle aches, chest pain, shortness of breath, mood changes.   Objective Blood pressure 110/70, pulse 84, weight 121 lb (54.885 kg), SpO2 99 %, unknown if currently breastfeeding.    General: No apparent distress alert and oriented x3 mood and affect normal, dressed appropriately.  HEENT: Pupils equal, extraocular movements intact  Respiratory: Patient's speak in full sentences and does not appear short of breath  Cardiovascular: No lower extremity edema, non tender, no erythema  Skin: Warm dry intact with no signs of infection or rash on extremities or on axial skeleton.  Abdomen: Soft nontender  Neuro: Cranial nerves II through XII are intact, neurovascularly intact in all extremities with 2+ DTRs and 2+ pulses.  Lymph: No lymphadenopathy of posterior or anterior cervical chain or axillae bilaterally.  Gait normal with good balance and coordination.  MSK:  Non tender with full range of motion and good stability and symmetric strength and tone of shoulders, elbows, wrist, hip, knee and ankles bilaterally. Patient states multiple joints do take to palpation today and Neck: Inspection unremarkable. Mild loss of lordosis Negative Spurling's maneuver. Full neck range of motion Grip strength and sensation normal in bilateral hands Strength good C4 to T1 distribution No sensory change to C4 to T1 Negative Hoffman sign bilaterally Reflexes normal Back Exam:  Inspection: Unremarkable  Motion: Flexion 45 deg, Extension 25deg, Side Bending to 35 deg bilaterally,  Rotation to 35 deg bilaterally more stiff than previous exam SLR laying: Negative  XSLR laying: Negative  Palpable tenderness:continued tenderness mostly of the cervical as well as lumbar paraspinal musculature FABER: Negative patella bilaterally Sensory change: Gross sensation intact to all lumbar and sacral dermatomes.  Reflexes: 2+ at both patellar tendons, 2+ at achilles tendons,  Babinski's downgoing.  Strength at foot  Plantar-flexion: 5/5 Dorsi-flexion: 5/5 Eversion: 5/5 Inversion: 5/5  Leg strength  Quad: 5/5 Hamstring: 5/5 Hip flexor: 5/5 Hip abductors: 4/5  Gait unremarkable.  Osteopathic  findings C2 flexed rotated and side bent left C6 flexed rotated and side bent right T3 extended rotated and side bent right T10 extended rotated and side bent right L2 flexed rotated and side bent left Sacrum left on left    Impression and Recommendations:     This case required medical decision making of moderate complexity.

## 2016-04-16 NOTE — Assessment & Plan Note (Signed)
Continues to have tightness overall. Seems to be relatively well after the osteopathic manipulation. We discussed possible formal physical therapy which patient declined. Patient continues to try to do the home exercises but finds it difficult secondary to other limitations throughout the day. Only uses the muscle relaxers needed. Follow-up again in 7-8 weeks.

## 2016-04-16 NOTE — Patient Instructions (Addendum)
Great to see you  Ice when you need it Keep working on the posture and the upper back strength  Dr. Amil Amen could help and will call you See me again 6-8 weeks

## 2016-04-20 ENCOUNTER — Other Ambulatory Visit: Payer: Self-pay | Admitting: Family Medicine

## 2016-05-25 ENCOUNTER — Encounter: Payer: Self-pay | Admitting: Family Medicine

## 2016-05-28 ENCOUNTER — Ambulatory Visit: Payer: Medicaid Other | Admitting: Family Medicine

## 2016-06-05 NOTE — Progress Notes (Signed)
[facility] [place], [place] [address] Phone: [phone] Subjective:     CC: Back pain follow up  [id number]  Autumn Mccoy is a 30 y.o. female coming in with complaint of back pain.  Patient was seen previously and was diagnosed with more of a muscle imbalances. Continues to have some difficulty. Patient states in the past she has been diagnosed with rheumatoid arthritis but had not been seen anyone.  Patient finally went to a rheumatologist again and we'll be starting on Humira. Patient states that she is having some increasing stress in tenderness more in the neck and upper back. No radiation down the arms. Concern of doing a medication like Humira long-term.   Past Medical History:  Diagnosis Date  . GDM (gestational diabetes mellitus)   . Gestational diabetes    diet controlled  . Hx of varicella   . Malignant brain tumor [facility])    age 46 treated with chemo and rad  . Rheumatoid arthritis [facility])    Past Surgical History:  Procedure Laterality Date  . BRAIN SURGERY  1995  . BRAIN TUMOR EXCISION  1995  . CESAREAN SECTION N/A 07/22/2014   Procedure: CESAREAN SECTION;  Surgeon: Logan Bores, MD;  Location: [facility];  Service: Obstetrics;  Laterality: N/A;   Social History  Substance Use Topics  . Smoking status: Never Smoker  . Smokeless tobacco: Never Used  . Alcohol use No   No Known Allergies Family History  Problem Relation Age of Onset  . Multiple sclerosis Mother    positive history of autoimmune diseases as well as multiple sclerosis    Past medical history, social, surgical and family history all reviewed in electronic medical record.   Review of Systems: No headache, visual changes, nausea, vomiting, diarrhea, constipation, dizziness, abdominal pain, skin rash, fevers, chills, night sweats, weight loss, swollen lymph nodes, body aches, joint swelling, muscle aches, chest pain, shortness of breath, mood  changes.   Objective  Blood pressure 118/78, pulse 78, weight 120 lb (54.4 kg), SpO2 98 %, unknown if currently breastfeeding.  General: No apparent distress alert and oriented x3 mood and affect normal, dressed appropriately.  HEENT: Pupils equal, extraocular movements intact  Respiratory: Patient's speak in full sentences and does not appear short of breath  Cardiovascular: No lower extremity edema, non tender, no erythema  Skin: Warm dry intact with no signs of infection or rash on extremities or on axial skeleton.  Abdomen: Soft nontender  Neuro: Cranial nerves II through XII are intact, neurovascularly intact in all extremities with 2+ DTRs and 2+ pulses.  Lymph: No lymphadenopathy of posterior or anterior cervical chain or axillae bilaterally.  Gait normal with good balance and coordination.  MSK:  Non tender with full range of motion and good stability and symmetric strength and tone of shoulders, elbows, wrist, hip, knee and ankles bilaterally. Patient states multiple joints do take to palpation today and Neck: Inspection unremarkable. Mild loss of lordosis Negative Spurling's maneuver. Mild limitation lacking the last 5 of rotation and side bending bilaterally Grip strength and sensation normal in bilateral hands Strength good C4 to T1 distribution No sensory change to C4 to T1 Negative Hoffman sign bilaterally Reflexes normal Back Exam:  Inspection: Unremarkable  Motion: Flexion 45 deg, Extension 25deg, Side Bending to 35 deg bilaterally,  Rotation to 35 deg bilaterally more stiff than previous exam SLR laying: Negative  XSLR laying: Negative  Palpable tenderness:continued tenderness mostly of the cervical  FABER: Negative patella bilaterally Sensory change: Gross sensation intact to all lumbar and sacral dermatomes.  Reflexes: 2+ at both patellar tendons, 2+ at achilles tendons, Babinski's downgoing.  Strength at foot  Plantar-flexion: 5/5 Dorsi-flexion: 5/5 Eversion:  5/5 Inversion: 5/5  Leg strength  Quad: 5/5 Hamstring: 5/5 Hip flexor: 5/5 Hip abductors: 4/5  Gait unremarkable.  Osteopathic findings C2 flexed rotated and side bent left C6 flexed rotated and side bent right T3 extended rotated and side bent right T8 extended rotated and side bent right L2 flexed rotated and side bent left Sacrum left on left    Impression and Recommendations:     This case required medical decision making of moderate complexity.

## 2016-06-05 NOTE — Assessment & Plan Note (Addendum)
Decision today to treat with OMT was based on Physical Exam  After verbal consent patient was treated with ME, FPR, ST techniques in cervical, thoracic, lumbar and sacral areas  Patient tolerated the procedure well with improvement in symptoms  Patient given exercises, stretches and lifestyle modifications  See medications in patient instructions if given  Patient will follow up in 6-8 weeks   Avoided HVLA secondary to patient's history of rheumatoid arthritis.

## 2016-06-05 NOTE — Assessment & Plan Note (Addendum)
Patient overall seems to be doing relatively well. We discussed icing regimen as well as ergonomics. Patient will come back and see me again in 6-8 weeks. No significant changes and treatment option at this time. I do feel that patient needs treatment for rheumatoid arthritis.

## 2016-06-06 ENCOUNTER — Ambulatory Visit (INDEPENDENT_AMBULATORY_CARE_PROVIDER_SITE_OTHER): Payer: No Typology Code available for payment source | Admitting: Family Medicine

## 2016-06-06 VITALS — BP 118/78 | HR 78 | Wt 120.0 lb

## 2016-06-06 DIAGNOSIS — M9903 Segmental and somatic dysfunction of lumbar region: Secondary | ICD-10-CM | POA: Diagnosis not present

## 2016-06-06 DIAGNOSIS — M069 Rheumatoid arthritis, unspecified: Secondary | ICD-10-CM | POA: Insufficient documentation

## 2016-06-06 DIAGNOSIS — M542 Cervicalgia: Secondary | ICD-10-CM | POA: Diagnosis not present

## 2016-06-06 DIAGNOSIS — M9901 Segmental and somatic dysfunction of cervical region: Secondary | ICD-10-CM

## 2016-06-06 DIAGNOSIS — M9902 Segmental and somatic dysfunction of thoracic region: Secondary | ICD-10-CM

## 2016-06-06 DIAGNOSIS — M0579 Rheumatoid arthritis with rheumatoid factor of multiple sites without organ or systems involvement: Secondary | ICD-10-CM

## 2016-06-06 DIAGNOSIS — M999 Biomechanical lesion, unspecified: Secondary | ICD-10-CM

## 2016-06-06 NOTE — Patient Instructions (Signed)
Good to see you  Ice is your friend I do think Humira could help  I hope this continues to help and I would like to see you again in 6-8 weeks.

## 2016-07-25 ENCOUNTER — Ambulatory Visit: Payer: Self-pay | Admitting: Family Medicine

## 2016-11-26 DIAGNOSIS — H903 Sensorineural hearing loss, bilateral: Secondary | ICD-10-CM | POA: Diagnosis not present

## 2016-11-26 DIAGNOSIS — H6123 Impacted cerumen, bilateral: Secondary | ICD-10-CM | POA: Diagnosis not present

## 2016-12-27 DIAGNOSIS — H903 Sensorineural hearing loss, bilateral: Secondary | ICD-10-CM | POA: Diagnosis not present

## 2017-01-01 DIAGNOSIS — M545 Low back pain: Secondary | ICD-10-CM | POA: Diagnosis not present

## 2017-01-01 DIAGNOSIS — M7989 Other specified soft tissue disorders: Secondary | ICD-10-CM | POA: Diagnosis not present

## 2017-01-01 DIAGNOSIS — R202 Paresthesia of skin: Secondary | ICD-10-CM | POA: Diagnosis not present

## 2017-01-01 DIAGNOSIS — M2550 Pain in unspecified joint: Secondary | ICD-10-CM | POA: Diagnosis not present

## 2017-01-01 DIAGNOSIS — M0579 Rheumatoid arthritis with rheumatoid factor of multiple sites without organ or systems involvement: Secondary | ICD-10-CM | POA: Diagnosis not present

## 2017-01-07 DIAGNOSIS — G894 Chronic pain syndrome: Secondary | ICD-10-CM | POA: Diagnosis not present

## 2017-01-31 ENCOUNTER — Encounter: Payer: Self-pay | Admitting: Neurology

## 2017-01-31 ENCOUNTER — Ambulatory Visit (INDEPENDENT_AMBULATORY_CARE_PROVIDER_SITE_OTHER): Payer: BLUE CROSS/BLUE SHIELD | Admitting: Neurology

## 2017-01-31 ENCOUNTER — Encounter (INDEPENDENT_AMBULATORY_CARE_PROVIDER_SITE_OTHER): Payer: Self-pay

## 2017-01-31 VITALS — BP 126/78 | HR 58 | Ht 59.0 in | Wt 125.0 lb

## 2017-01-31 DIAGNOSIS — M791 Myalgia, unspecified site: Secondary | ICD-10-CM

## 2017-01-31 DIAGNOSIS — R202 Paresthesia of skin: Secondary | ICD-10-CM

## 2017-01-31 DIAGNOSIS — G4489 Other headache syndrome: Secondary | ICD-10-CM | POA: Diagnosis not present

## 2017-01-31 MED ORDER — DULOXETINE HCL 30 MG PO CPEP: 30.0000 mg | ORAL_CAPSULE | Freq: Two times a day (BID) | ORAL | 3 refills | Status: DC

## 2017-01-31 NOTE — Progress Notes (Signed)
Reason for visit: Myalgias  Referring physician: Dr. Carlos Levering Alford is a 31 y.o. 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 The patient underwent tumor therapy and radiation therapy for this tumor. The patient has done well for a number of years. She was diagnosed with rheumatoid arthritis, but she stopped treatment with pregnancy, and has done well until recently. The patient has noted some symptoms over the last 2 years with migratory myalgias in the neck, shoulders, back, thighs, and legs. She does report trigger points in these areas that may migrate around the body. The patient has also developed paresthesias that go down the arms and down the legs to the feet. The patient has developed daily headaches over the last year that are generalized in nature, unassociated with nausea or vomiting, photophobia or phonophobia. The patient generally feels better in the morning and worse as the day goes on. The patient does have some occasional dizziness, she reports some mild memory problems. She does have some blurring of vision. She denies any issues controlling the bowels or the bladder. She has a mild balance issue, she denies any falls. She does report some generalized fatigue and she does have a sensation of some depression at times. The patient was seen by Dr. Nelva Bush, and she is referred to this office for the above symptoms. The patient has not undergone MRI evaluation of the brain for about 9 years. The original brain surgery was done at [facility].   Past Medical History:  Diagnosis Date  . GDM (gestational diabetes mellitus)   . Gestational diabetes    diet controlled  . Hx of varicella   . Malignant brain tumor [facility])    age 56 treated with chemo and rad  . Rheumatoid arthritis [facility])     Past Surgical History:  Procedure Laterality Date  . BRAIN SURGERY  1995  .  BRAIN TUMOR EXCISION  1995  . CESAREAN SECTION N/A 07/22/2014   Procedure: CESAREAN SECTION;  Surgeon: Logan Bores, MD;  Location: [facility];  Service: Obstetrics;  Laterality: N/A;    Family History  Problem Relation Age of Onset  . Multiple sclerosis Mother     Social history:  reports that she has never smoked. She has never used smokeless tobacco. She reports that she does not drink alcohol or use drugs.  Medications:  Prior to Admission medications   Medication Sig Start Date End Date Taking? Authorizing Provider  b complex vitamins tablet Take 1 tablet by mouth daily.   Yes Historical Provider, MD  Cholecalciferol (VITAMIN D PO) Take 1 Dose by mouth daily.   Yes Historical Provider, MD  gabapentin (NEURONTIN) 100 MG capsule Take 100 mg by mouth 3 (three) times daily.   Yes Historical Provider, MD  ibuprofen (ADVIL,MOTRIN) 600 MG tablet Take 1 tablet (600 mg total) by mouth every 6 (six) hours as needed. 07/25/14  Yes Newton Pigg, MD  MAGNESIUM PO Take 500 mg by mouth daily.   Yes Historical Provider, MD  meloxicam (MOBIC) 15 MG tablet TAKE 1 TABLET BY MOUTH DAILY. 04/23/16  Yes Lyndal Pulley, DO  Omeprazole (PRILOSEC PO) Take 1 tablet by mouth daily.   Yes Historical Provider, MD     No Known Allergies  ROS:  Out of a complete 14 system review of symptoms, the patient complains only of the following symptoms, and all  other reviewed systems are negative.  Fatigue Chest pain, swelling in the legs Memory loss, ringing in the ears Skin rash, itching Blurred vision, eye pain Cough Easy bruising Increased thirst Joint pain, joint swelling, muscle cramps, aching muscles Memory loss, confusion, headache, numbness, dizziness Depression, not enough sleep Restless legs  Blood pressure 126/78, pulse (!) 58, height 4\' 11"  (1.499 m), weight 125 lb (56.7 kg), unknown if currently breastfeeding.  Physical Exam  General: The patient is alert and cooperative at the time of  the examination. The patient is minimally obese.  Eyes: Pupils are equal, round, and reactive to light. Discs are flat bilaterally.  Neck: The neck is supple, no carotid bruits are noted.  Respiratory: The respiratory examination is clear.  Cardiovascular: The cardiovascular examination reveals a regular rate and rhythm, no obvious murmurs or rubs are noted.  Skin: Extremities are without significant edema.  Neurologic Exam  Mental status: The patient is alert and oriented x 3 at the time of the examination. The patient has apparent normal recent and remote memory, with an apparently normal attention span and concentration ability.  Cranial nerves: Facial symmetry is present. There is good sensation of the face to pinprick and soft touch bilaterally. The strength of the facial muscles and the muscles to head turning and shoulder shrug are normal bilaterally. Speech is well enunciated, no aphasia or dysarthria is noted. Extraocular movements are full. Visual fields are full. The tongue is midline, and the patient has symmetric elevation of the soft palate. No obvious hearing deficits are noted.  Motor: The motor testing reveals 5 over 5 strength of all 4 extremities. Good symmetric motor tone is noted throughout.  Sensory: Sensory testing is intact to pinprick, soft touch, vibration sensation, and position sense on all 4 extremities, with exception of some decrease in position sense in the right foot. No evidence of extinction is noted.  Coordination: Cerebellar testing reveals good finger-nose-finger and heel-to-shin bilaterally.  Gait and station: Gait is normal. Tandem gait is slightly unsteady. Romberg is negative. No drift is seen.  Reflexes: Deep tendon reflexes are symmetric and normal bilaterally. Toes are downgoing bilaterally.   Assessment/Plan:  1. History of neuroblastoma, status post resection, chemotherapy, radiation  2. History of rheumatoid arthritis  3. Diffuse  myalgias, paresthesias   4. Chronic daily headache  The patient does have a history of rheumatoid arthritis and is followed through a rheumatologist. The patient is developing daily headaches and diffuse myalgias that could be consistent with a fibromyalgia syndrome. The patient be set up for further blood work today, she will have MRI evaluation of the brain. The patient will follow-up in about 3 months. She will be placed on Cymbalta for the headache and for the diffuse myalgias. There may be some underlying depression with this patient.   Jill Alexanders MD 01/31/2017 10:36 AM  [facility] [address] [place], [place] [address]  Phone [phone] Fax [phone]

## 2017-01-31 NOTE — Patient Instructions (Signed)
   With the cymbalta 30 mg take one daily for 2 weeks, then take one twice a day.  Cymbalta (duloxetine) is an antidepressant medication that is commonly used for peripheral neuropathy pain or for fibromyalgia pain. As with any antidepressant medication, worsening depression can be seen. This medication can potentially cause headache, dizziness, sexual dysfunction, or nausea. If any problems are noted on this medication, please contact our office.   We will check blood work today and get MRI of the brain.

## 2017-02-04 LAB — COMPREHENSIVE METABOLIC PANEL
ALT: 21 IU/L (ref 0–32)
AST: 22 IU/L (ref 0–40)
Albumin/Globulin Ratio: 1.6 (ref 1.2–2.2)
Albumin: 4.7 g/dL (ref 3.5–5.5)
Alkaline Phosphatase: 64 IU/L (ref 39–117)
BUN/Creatinine Ratio: 14 (ref 9–23)
BUN: 10 mg/dL (ref 6–20)
Bilirubin Total: 0.2 mg/dL (ref 0.0–1.2)
CALCIUM: 10.1 mg/dL (ref 8.7–10.2)
CHLORIDE: 99 mmol/L (ref 96–106)
CO2: 24 mmol/L (ref 18–29)
Creatinine, Ser: 0.73 mg/dL (ref 0.57–1.00)
GFR calc non Af Amer: 109 mL/min/{1.73_m2} (ref >59)
GFR, EST AFRICAN AMERICAN: 126 mL/min/{1.73_m2} (ref >59)
Globulin, Total: 3 g/dL (ref 1.5–4.5)
Glucose: 101 mg/dL — ABNORMAL HIGH (ref 65–99)
Potassium: 5.2 mmol/L (ref 3.5–5.2)
Sodium: 142 mmol/L (ref 134–144)
TOTAL PROTEIN: 7.7 g/dL (ref 6.0–8.5)

## 2017-02-04 LAB — CBC WITH DIFFERENTIAL/PLATELET
BASOS ABS: 0 10*3/uL (ref 0.0–0.2)
BASOS: 1 %
EOS (ABSOLUTE): 0.1 10*3/uL (ref 0.0–0.4)
Eos: 2 %
HEMOGLOBIN: 12.1 g/dL (ref 11.1–15.9)
Hematocrit: 36.7 % (ref 34.0–46.6)
IMMATURE GRANULOCYTES: 0 %
Immature Grans (Abs): 0 10*3/uL (ref 0.0–0.1)
LYMPHS: 28 %
Lymphocytes Absolute: 1.5 10*3/uL (ref 0.7–3.1)
MCH: 28.1 pg (ref 26.6–33.0)
MCHC: 33 g/dL (ref 31.5–35.7)
MCV: 85 fL (ref 79–97)
Monocytes Absolute: 0.5 10*3/uL (ref 0.1–0.9)
Monocytes: 9 %
NEUTROS ABS: 3.4 10*3/uL (ref 1.4–7.0)
NEUTROS PCT: 60 %
Platelets: 303 10*3/uL (ref 150–379)
RBC: 4.31 x10E6/uL (ref 3.77–5.28)
RDW: 14.8 % (ref 12.3–15.4)
WBC: 5.5 10*3/uL (ref 3.4–10.8)

## 2017-02-04 LAB — CK: Total CK: 93 U/L (ref 24–173)

## 2017-02-04 LAB — VITAMIN B12: Vitamin B-12: 751 pg/mL (ref 232–1245)

## 2017-02-04 LAB — B. BURGDORFI ANTIBODIES: Lyme IgG/IgM Ab: <0.91 {ISR} (ref 0.00–0.90)

## 2017-02-04 LAB — VITAMIN D 25 HYDROXY (VIT D DEFICIENCY, FRACTURES): VIT D 25 HYDROXY: 25.5 ng/mL — AB (ref 30.0–100.0)

## 2017-02-04 LAB — SEDIMENTATION RATE: Sed Rate: 10 mm/hr (ref 0–32)

## 2017-02-05 ENCOUNTER — Telehealth: Payer: Self-pay | Admitting: *Deleted

## 2017-02-05 NOTE — Telephone Encounter (Signed)
-----   Message from Kathrynn Ducking, MD sent at 02/04/2017  4:57 PM EDT -----  The blood work results are unremarkable, with exception of a slightly low vitamin D level, would recommend getting on two thousand international unit tablet of vitamin D daily. Please call the patient. ----- Message ----- From: Lavone Neri Lab Results In Sent: 02/01/2017   7:43 AM To: Kathrynn Ducking, MD

## 2017-02-05 NOTE — Telephone Encounter (Signed)
Called and spoke with patient about lab results. Patient verbalized understanding. She repeated back directions for Vit D OTC 2000IU daily correctly to pick up.

## 2017-03-22 ENCOUNTER — Other Ambulatory Visit: Payer: Self-pay | Admitting: *Deleted

## 2017-03-22 MED ORDER — DULOXETINE HCL 30 MG PO CPEP: 30.0000 mg | ORAL_CAPSULE | Freq: Two times a day (BID) | ORAL | 0 refills | Status: DC

## 2017-03-25 DIAGNOSIS — M0579 Rheumatoid arthritis with rheumatoid factor of multiple sites without organ or systems involvement: Secondary | ICD-10-CM | POA: Diagnosis not present

## 2017-04-22 ENCOUNTER — Encounter: Payer: Self-pay | Admitting: Adult Health

## 2017-04-22 ENCOUNTER — Ambulatory Visit (INDEPENDENT_AMBULATORY_CARE_PROVIDER_SITE_OTHER): Payer: BLUE CROSS/BLUE SHIELD | Admitting: Adult Health

## 2017-04-22 VITALS — BP 111/76 | HR 69 | Wt 121.8 lb

## 2017-04-22 DIAGNOSIS — R519 Headache, unspecified: Secondary | ICD-10-CM

## 2017-04-22 DIAGNOSIS — R51 Headache: Secondary | ICD-10-CM

## 2017-04-22 DIAGNOSIS — R202 Paresthesia of skin: Secondary | ICD-10-CM

## 2017-04-22 MED ORDER — DULOXETINE HCL 30 MG PO CPEP: ORAL_CAPSULE | ORAL | 5 refills | Status: DC

## 2017-04-22 NOTE — Progress Notes (Signed)
I have read the note, and I agree with the clinical assessment and plan.  Ryelynn Guedea KEITH   

## 2017-04-22 NOTE — Patient Instructions (Signed)
Your Plan:  Increase Cymbalta to 1 tablet in the morning and 2 tablets at bedtime MRI brain- Autumn Mccoy will call to schedule- if you do not hear from our office in 1 week please call. If your symptoms worsen or you develop new symptoms please let us know.     Thank you for coming to see [place] at [facility]. I hope we have been able to provide you high quality care today.  You may receive a patient satisfaction survey over the next few weeks. We would appreciate your feedback and comments so that we may continue to improve ourselves and the health of our patients.

## 2017-04-22 NOTE — Progress Notes (Signed)
PATIENT: Autumn Mccoy DOB: January 17, 1986  REASON FOR VISIT: follow up-paresthesias, headaches HISTORY FROM: patient  HISTORY OF PRESENT ILLNESS: Today 04/22/17 Autumn Mccoy is a 31 year old female with a history of paresthesias and headaches. She returns today for follow-up. She reports that she continues to have diffuse pain all over. She has back pain and shoulder pain. She also reports pain in the legs however she feels that this pain may be a result of rheumatoid arthritis. She also states that she has a headache daily. She reports that her head hurts all over. She does not have photophobia or phonophobia. Denies nausea or vomiting. She states that she has been taking Cymbalta 30 mg twice a day. She has not seen any change in her symptoms. An MRI of the brain was ordered however the patient has not completed this. She returns today for an evaluation.  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 The patient underwent tumor therapy and radiation therapy for this tumor. The patient has done well for a number of years. She was diagnosed with rheumatoid arthritis, but she stopped treatment with pregnancy, and has done well until recently. The patient has noted some symptoms over the last 2 years with migratory myalgias in the neck, shoulders, back, thighs, and legs. She does report trigger points in these areas that may migrate around the body. The patient has also developed paresthesias that go down the arms and down the legs to the feet. The patient has developed daily headaches over the last year that are generalized in nature, unassociated with nausea or vomiting, photophobia or phonophobia. The patient generally feels better in the morning and worse as the day goes on. The patient does have some occasional dizziness, she reports some mild memory problems. She does have some blurring of vision. She denies any  issues controlling the bowels or the bladder. She has a mild balance issue, she denies any falls. She does report some generalized fatigue and she does have a sensation of some depression at times. The patient was seen by Dr. Nelva Bush, and she is referred to this office for the above symptoms. The patient has not undergone MRI evaluation of the brain for about 9 years. The original brain surgery was done at [facility].    REVIEW OF SYSTEMS: Out of a complete 14 system review of symptoms, the patient complains only of the following symptoms, and all other reviewed systems are negative.  Fatigue, hearing loss, ear pain, ringing in ears, chest tightness, eye itching, chest pain, leg swelling, restless leg, daytime sleepiness, joint pain, joint swelling, back pain, aching muscles, muscle cramps, neck pain, itching, frequency of urination, bruise/bleed easily, dizziness, headache, numbness, weakness, depression, nervous/anxious  ALLERGIES: No Known Allergies  HOME MEDICATIONS: Outpatient Medications Prior to Visit  Medication Sig Dispense Refill  . b complex vitamins tablet Take 1 tablet by mouth daily.    . Cholecalciferol (VITAMIN D PO) Take 1 Dose by mouth daily.    . DULoxetine (CYMBALTA) 30 MG capsule Take 1 capsule (30 mg total) by mouth 2 (two) times daily. 180 capsule 0  . gabapentin (NEURONTIN) 100 MG capsule Take 100 mg by mouth 3 (three) times daily.    Marland Kitchen ibuprofen (ADVIL,MOTRIN) 600 MG tablet Take 1 tablet (600 mg total) by mouth every 6 (six) hours as needed. 30 tablet 0  . MAGNESIUM PO Take 500 mg by mouth daily.    Marland Kitchen  meloxicam (MOBIC) 15 MG tablet TAKE 1 TABLET BY MOUTH DAILY. 90 tablet 1  . Omeprazole (PRILOSEC PO) Take 1 tablet by mouth daily.     No facility-administered medications prior to visit.     PAST MEDICAL HISTORY: Past Medical History:  Diagnosis Date  . GDM (gestational diabetes mellitus)   . Gestational diabetes    diet controlled  . Hx of varicella   .  Malignant brain tumor [facility])    age 24 treated with chemo and rad  . Rheumatoid arthritis ([place])     PAST SURGICAL HISTORY: Past Surgical History:  Procedure Laterality Date  . BRAIN SURGERY  1995  . BRAIN TUMOR EXCISION  1995  . CESAREAN SECTION N/A 07/22/2014   Procedure: CESAREAN SECTION;  Surgeon: Logan Bores, MD;  Location: [facility];  Service: Obstetrics;  Laterality: N/A;    FAMILY HISTORY: Family History  Problem Relation Age of Onset  . Multiple sclerosis Mother     SOCIAL HISTORY: Social History   Social History  . Marital status: Single    Spouse name: N/A  . Number of children: 1  . Years of education: College   Occupational History  . Specialized services and personnal    Social History Main Topics  . Smoking status: Never Smoker  . Smokeless tobacco: Never Used  . Alcohol use No  . Drug use: No  . Sexual activity: Yes    Birth control/ protection: None   Other Topics Concern  . Not on file   Social History Narrative   Lives with mother and father   Caffeine use: Tea/soda daily   Left-handed   ** Merged History Encounter **          PHYSICAL EXAM  Vitals:   04/22/17 0916  BP: 111/76  Pulse: 69  Weight: 121 lb 12.8 oz (55.2 kg)   Body mass index is 24.6 kg/m.  Generalized: Well developed, in no acute distress   Neurological examination  Mentation: Alert oriented to time, place, history taking. Follows all commands speech and language fluent Cranial nerve II-XII: Pupils were equal round reactive to light. Extraocular movements were full, visual field were full on confrontational test. Facial sensation and strength were normal. Uvula tongue midline. Head turning and shoulder shrug  were normal and symmetric. Motor: The motor testing reveals 5 over 5 strength of all 4 extremities. Good symmetric motor tone is noted throughout.  Sensory: Sensory testing is intact to soft touch on all 4 extremities. No evidence of extinction is noted.    Coordination: Cerebellar testing reveals good finger-nose-finger and heel-to-shin bilaterally.  Gait and station: Gait is normal. Tandem gait is unsteady. Romberg is negative. No drift is seen.  Reflexes: Deep tendon reflexes are symmetric and normal bilaterally.   DIAGNOSTIC DATA (LABS, IMAGING, TESTING) - I reviewed patient records, labs, notes, testing and imaging myself where available.  Lab Results  Component Value Date   WBC 5.5 01/31/2017   HGB 12.1 01/31/2017   HCT 36.7 01/31/2017   MCV 85 01/31/2017   PLT 303 01/31/2017      Component Value Date/Time   NA 142 01/31/2017 1137   K 5.2 01/31/2017 1137   CL 99 01/31/2017 1137   CO2 24 01/31/2017 1137   GLUCOSE 101 (H) 01/31/2017 1137   GLUCOSE 75 07/24/2014 0908   BUN 10 01/31/2017 1137   CREATININE 0.73 01/31/2017 1137   CALCIUM 10.1 01/31/2017 1137   PROT 7.7 01/31/2017 1137   ALBUMIN 4.7 01/31/2017  1137   AST 22 01/31/2017 1137   ALT 21 01/31/2017 1137   ALKPHOS 64 01/31/2017 1137   BILITOT 0.2 01/31/2017 1137   GFRNONAA 109 01/31/2017 1137   GFRAA 126 01/31/2017 1137    Lab Results  Component Value Date   [id number] 008 01/31/2017      ASSESSMENT AND PLAN 31 y.o. year old female  has a past medical history of GDM (gestational diabetes mellitus); Gestational diabetes; varicella; Malignant brain tumor ([place]); and Rheumatoid arthritis ([place]). here with:  1. Daily headaches 2. Paresthesias  The patient continues to have daily headaches as well as diffuse pain throughout the body. We will increase Cymbalta to 30 mg in the morning and 60 mg in the evening. She is also on gabapentin taking 100 mg 3 times a day. This may need to be increased in the future. The patient will be scheduled for MRI of the brain. She is advised that if her symptoms worsen or she develops new symptoms she should let us know. She will follow-up in 3 months or sooner if needed.  I spent 15 minutes with the patient. 50% of this time was  spent discussing her medication.    Ward Givens, MSN, NP-C 04/22/2017, 9:08 AM [facility] [address] [place], [place] [address] [phone]

## 2017-04-30 DIAGNOSIS — M2550 Pain in unspecified joint: Secondary | ICD-10-CM | POA: Diagnosis not present

## 2017-04-30 DIAGNOSIS — M0579 Rheumatoid arthritis with rheumatoid factor of multiple sites without organ or systems involvement: Secondary | ICD-10-CM | POA: Diagnosis not present

## 2017-04-30 DIAGNOSIS — M545 Low back pain: Secondary | ICD-10-CM | POA: Diagnosis not present

## 2017-05-01 ENCOUNTER — Ambulatory Visit (INDEPENDENT_AMBULATORY_CARE_PROVIDER_SITE_OTHER): Payer: BLUE CROSS/BLUE SHIELD

## 2017-05-01 DIAGNOSIS — G4489 Other headache syndrome: Secondary | ICD-10-CM | POA: Diagnosis not present

## 2017-05-01 DIAGNOSIS — R202 Paresthesia of skin: Secondary | ICD-10-CM

## 2017-05-01 DIAGNOSIS — M791 Myalgia, unspecified site: Secondary | ICD-10-CM

## 2017-05-01 MED ORDER — GADOPENTETATE DIMEGLUMINE 469.01 MG/ML IV SOLN
11.0000 mL | Freq: Once | INTRAVENOUS | Status: AC | PRN
  Administered 2017-05-01: 11 mL via INTRAVENOUS

## 2017-05-04 ENCOUNTER — Telehealth: Payer: Self-pay | Admitting: Neurology

## 2017-05-04 NOTE — Telephone Encounter (Signed)
Error

## 2017-05-04 NOTE — Telephone Encounter (Signed)
  I called patient. MRI the brain is stable, no change from 2014. The patient is on Cymbalta and low-dose gabapentin, these medications can be increased if her neuromuscular symptoms persist. I discussed the results with the patient.  MRI brain 05/03/17:  IMPRESSION:  Abnormal MRI brain (with and without) demonstrating: 1. Cystic cerebellar resection cavity with surrounding gliosis, communicating with the 4th ventricle, stable from prior exam.  2. No abnormal lesions are seen on post contrast views.   3. Few small subcortical and juxtacortical foci of non-specific gliosis. 4. No change from MRI on 08/24/13.

## 2017-05-07 DIAGNOSIS — L03113 Cellulitis of right upper limb: Secondary | ICD-10-CM | POA: Diagnosis not present

## 2017-05-07 DIAGNOSIS — M059 Rheumatoid arthritis with rheumatoid factor, unspecified: Secondary | ICD-10-CM | POA: Diagnosis not present

## 2017-05-27 DIAGNOSIS — Z0000 Encounter for general adult medical examination without abnormal findings: Secondary | ICD-10-CM | POA: Diagnosis not present

## 2017-05-27 DIAGNOSIS — Z1389 Encounter for screening for other disorder: Secondary | ICD-10-CM | POA: Diagnosis not present

## 2017-05-28 ENCOUNTER — Other Ambulatory Visit: Payer: Self-pay | Admitting: *Deleted

## 2017-05-28 MED ORDER — DULOXETINE HCL 30 MG PO CPEP: ORAL_CAPSULE | ORAL | 2 refills | Status: AC

## 2017-05-28 NOTE — Telephone Encounter (Signed)
Received request for 90 day supply from pharmacy.

## 2017-06-03 DIAGNOSIS — H903 Sensorineural hearing loss, bilateral: Secondary | ICD-10-CM | POA: Diagnosis not present

## 2017-06-03 DIAGNOSIS — H905 Unspecified sensorineural hearing loss: Secondary | ICD-10-CM | POA: Diagnosis not present

## 2017-06-18 DIAGNOSIS — H905 Unspecified sensorineural hearing loss: Secondary | ICD-10-CM | POA: Diagnosis not present

## 2017-07-11 DIAGNOSIS — J22 Unspecified acute lower respiratory infection: Secondary | ICD-10-CM | POA: Diagnosis not present

## 2017-07-23 ENCOUNTER — Ambulatory Visit: Payer: Self-pay | Admitting: Adult Health

## 2017-08-05 DIAGNOSIS — M2550 Pain in unspecified joint: Secondary | ICD-10-CM | POA: Diagnosis not present

## 2017-08-05 DIAGNOSIS — R202 Paresthesia of skin: Secondary | ICD-10-CM | POA: Diagnosis not present

## 2017-08-05 DIAGNOSIS — M545 Low back pain: Secondary | ICD-10-CM | POA: Diagnosis not present

## 2017-08-05 DIAGNOSIS — M0579 Rheumatoid arthritis with rheumatoid factor of multiple sites without organ or systems involvement: Secondary | ICD-10-CM | POA: Diagnosis not present

## 2017-08-14 ENCOUNTER — Ambulatory Visit: Payer: Self-pay | Admitting: Adult Health

## 2017-08-15 ENCOUNTER — Encounter: Payer: Self-pay | Admitting: Adult Health

## 2017-08-20 DIAGNOSIS — M0579 Rheumatoid arthritis with rheumatoid factor of multiple sites without organ or systems involvement: Secondary | ICD-10-CM | POA: Diagnosis not present

## 2017-09-17 DIAGNOSIS — M0579 Rheumatoid arthritis with rheumatoid factor of multiple sites without organ or systems involvement: Secondary | ICD-10-CM | POA: Diagnosis not present

## 2017-09-28 DIAGNOSIS — R509 Fever, unspecified: Secondary | ICD-10-CM | POA: Diagnosis not present

## 2017-09-28 DIAGNOSIS — B349 Viral infection, unspecified: Secondary | ICD-10-CM | POA: Diagnosis not present

## 2017-11-05 DIAGNOSIS — M545 Low back pain: Secondary | ICD-10-CM | POA: Diagnosis not present

## 2017-11-05 DIAGNOSIS — M0579 Rheumatoid arthritis with rheumatoid factor of multiple sites without organ or systems involvement: Secondary | ICD-10-CM | POA: Diagnosis not present

## 2017-11-05 DIAGNOSIS — M2550 Pain in unspecified joint: Secondary | ICD-10-CM | POA: Diagnosis not present

## 2018-03-04 DIAGNOSIS — N3091 Cystitis, unspecified with hematuria: Secondary | ICD-10-CM | POA: Diagnosis not present

## 2018-03-04 DIAGNOSIS — N3001 Acute cystitis with hematuria: Secondary | ICD-10-CM | POA: Diagnosis not present

## 2018-03-10 DIAGNOSIS — R202 Paresthesia of skin: Secondary | ICD-10-CM | POA: Diagnosis not present

## 2018-03-10 DIAGNOSIS — M545 Low back pain: Secondary | ICD-10-CM | POA: Diagnosis not present

## 2018-03-10 DIAGNOSIS — M2550 Pain in unspecified joint: Secondary | ICD-10-CM | POA: Diagnosis not present

## 2018-03-10 DIAGNOSIS — M0579 Rheumatoid arthritis with rheumatoid factor of multiple sites without organ or systems involvement: Secondary | ICD-10-CM | POA: Diagnosis not present

## 2018-03-11 DIAGNOSIS — Z130 Encounter for screening for diseases of the blood and blood-forming organs and certain disorders involving the immune mechanism: Secondary | ICD-10-CM | POA: Diagnosis not present

## 2018-03-11 DIAGNOSIS — Z124 Encounter for screening for malignant neoplasm of cervix: Secondary | ICD-10-CM | POA: Diagnosis not present

## 2018-03-11 DIAGNOSIS — Z113 Encounter for screening for infections with a predominantly sexual mode of transmission: Secondary | ICD-10-CM | POA: Diagnosis not present

## 2018-03-11 DIAGNOSIS — Z1151 Encounter for screening for human papillomavirus (HPV): Secondary | ICD-10-CM | POA: Diagnosis not present

## 2018-03-11 DIAGNOSIS — Z01419 Encounter for gynecological examination (general) (routine) without abnormal findings: Secondary | ICD-10-CM | POA: Diagnosis not present

## 2018-03-11 DIAGNOSIS — Z6825 Body mass index (BMI) 25.0-25.9, adult: Secondary | ICD-10-CM | POA: Diagnosis not present

## 2018-03-11 DIAGNOSIS — Z1389 Encounter for screening for other disorder: Secondary | ICD-10-CM | POA: Diagnosis not present

## 2018-03-13 DIAGNOSIS — N3080 Other cystitis without hematuria: Secondary | ICD-10-CM | POA: Diagnosis not present

## 2018-03-13 DIAGNOSIS — R300 Dysuria: Secondary | ICD-10-CM | POA: Diagnosis not present

## 2018-03-18 DIAGNOSIS — M545 Low back pain: Secondary | ICD-10-CM | POA: Diagnosis not present

## 2018-03-18 DIAGNOSIS — R350 Frequency of micturition: Secondary | ICD-10-CM | POA: Diagnosis not present

## 2018-04-17 DIAGNOSIS — H90A32 Mixed conductive and sensorineural hearing loss, unilateral, left ear with restricted hearing on the contralateral side: Secondary | ICD-10-CM | POA: Diagnosis not present

## 2018-04-17 DIAGNOSIS — H9202 Otalgia, left ear: Secondary | ICD-10-CM | POA: Diagnosis not present

## 2018-04-17 DIAGNOSIS — H60312 Diffuse otitis externa, left ear: Secondary | ICD-10-CM | POA: Diagnosis not present

## 2018-04-23 DIAGNOSIS — R350 Frequency of micturition: Secondary | ICD-10-CM | POA: Diagnosis not present

## 2018-04-23 DIAGNOSIS — M545 Low back pain: Secondary | ICD-10-CM | POA: Diagnosis not present

## 2018-04-23 DIAGNOSIS — R102 Pelvic and perineal pain: Secondary | ICD-10-CM | POA: Diagnosis not present

## 2018-05-14 DIAGNOSIS — M62838 Other muscle spasm: Secondary | ICD-10-CM | POA: Diagnosis not present

## 2018-05-14 DIAGNOSIS — M6281 Muscle weakness (generalized): Secondary | ICD-10-CM | POA: Diagnosis not present

## 2018-05-14 DIAGNOSIS — R102 Pelvic and perineal pain: Secondary | ICD-10-CM | POA: Diagnosis not present

## 2018-05-28 DIAGNOSIS — M2550 Pain in unspecified joint: Secondary | ICD-10-CM | POA: Diagnosis not present

## 2018-05-28 DIAGNOSIS — M0579 Rheumatoid arthritis with rheumatoid factor of multiple sites without organ or systems involvement: Secondary | ICD-10-CM | POA: Diagnosis not present

## 2018-05-28 DIAGNOSIS — Z6826 Body mass index (BMI) 26.0-26.9, adult: Secondary | ICD-10-CM | POA: Diagnosis not present

## 2018-05-28 DIAGNOSIS — M545 Low back pain: Secondary | ICD-10-CM | POA: Diagnosis not present

## 2018-07-23 DIAGNOSIS — M545 Low back pain: Secondary | ICD-10-CM | POA: Diagnosis not present

## 2018-07-23 DIAGNOSIS — M0579 Rheumatoid arthritis with rheumatoid factor of multiple sites without organ or systems involvement: Secondary | ICD-10-CM | POA: Diagnosis not present

## 2018-07-23 DIAGNOSIS — M2550 Pain in unspecified joint: Secondary | ICD-10-CM | POA: Diagnosis not present

## 2018-08-25 DIAGNOSIS — B354 Tinea corporis: Secondary | ICD-10-CM | POA: Diagnosis not present

## 2018-08-25 DIAGNOSIS — L237 Allergic contact dermatitis due to plants, except food: Secondary | ICD-10-CM | POA: Diagnosis not present

## 2018-08-25 DIAGNOSIS — L299 Pruritus, unspecified: Secondary | ICD-10-CM | POA: Diagnosis not present

## 2018-11-06 DIAGNOSIS — Z3202 Encounter for pregnancy test, result negative: Secondary | ICD-10-CM | POA: Diagnosis not present

## 2018-11-06 DIAGNOSIS — Z309 Encounter for contraceptive management, unspecified: Secondary | ICD-10-CM | POA: Diagnosis not present

## 2018-11-06 DIAGNOSIS — Z113 Encounter for screening for infections with a predominantly sexual mode of transmission: Secondary | ICD-10-CM | POA: Diagnosis not present

## 2018-11-06 DIAGNOSIS — N920 Excessive and frequent menstruation with regular cycle: Secondary | ICD-10-CM | POA: Diagnosis not present

## 2018-11-13 DIAGNOSIS — M0579 Rheumatoid arthritis with rheumatoid factor of multiple sites without organ or systems involvement: Secondary | ICD-10-CM | POA: Diagnosis not present

## 2018-11-13 DIAGNOSIS — Z79899 Other long term (current) drug therapy: Secondary | ICD-10-CM | POA: Diagnosis not present

## 2018-12-09 DIAGNOSIS — H6042 Cholesteatoma of left external ear: Secondary | ICD-10-CM | POA: Diagnosis not present

## 2018-12-09 DIAGNOSIS — H9193 Unspecified hearing loss, bilateral: Secondary | ICD-10-CM | POA: Diagnosis not present

## 2018-12-09 DIAGNOSIS — H7202 Central perforation of tympanic membrane, left ear: Secondary | ICD-10-CM | POA: Diagnosis not present

## 2018-12-09 DIAGNOSIS — H938X1 Other specified disorders of right ear: Secondary | ICD-10-CM | POA: Diagnosis not present

## 2019-02-19 DIAGNOSIS — Z3041 Encounter for surveillance of contraceptive pills: Secondary | ICD-10-CM | POA: Diagnosis not present

## 2019-02-24 DIAGNOSIS — H938X2 Other specified disorders of left ear: Secondary | ICD-10-CM | POA: Diagnosis not present

## 2019-02-24 DIAGNOSIS — M26623 Arthralgia of bilateral temporomandibular joint: Secondary | ICD-10-CM | POA: Diagnosis not present

## 2019-02-24 DIAGNOSIS — H6042 Cholesteatoma of left external ear: Secondary | ICD-10-CM | POA: Diagnosis not present

## 2019-04-27 DIAGNOSIS — M542 Cervicalgia: Secondary | ICD-10-CM | POA: Diagnosis not present

## 2019-04-27 DIAGNOSIS — M0579 Rheumatoid arthritis with rheumatoid factor of multiple sites without organ or systems involvement: Secondary | ICD-10-CM | POA: Diagnosis not present

## 2019-04-27 DIAGNOSIS — Z79899 Other long term (current) drug therapy: Secondary | ICD-10-CM | POA: Diagnosis not present

## 2019-07-27 DIAGNOSIS — M0579 Rheumatoid arthritis with rheumatoid factor of multiple sites without organ or systems involvement: Secondary | ICD-10-CM | POA: Diagnosis not present

## 2019-07-27 DIAGNOSIS — Z79899 Other long term (current) drug therapy: Secondary | ICD-10-CM | POA: Diagnosis not present

## 2019-07-27 DIAGNOSIS — Z23 Encounter for immunization: Secondary | ICD-10-CM | POA: Diagnosis not present

## 2021-01-17 ENCOUNTER — Ambulatory Visit: Payer: Self-pay

## 2022-05-24 ENCOUNTER — Other Ambulatory Visit: Payer: Self-pay | Admitting: Oncology

## 2022-05-24 DIAGNOSIS — D649 Anemia, unspecified: Secondary | ICD-10-CM

## 2022-05-24 NOTE — Progress Notes (Signed)
[facility]  [address] [place],  [place]  [address] [phone]  Clinic Day:  05/25/2022  Referring physician: Melony Overly, MD  HISTORY OF PRESENT ILLNESS:  The patient is a 36 y.o. female  who I was asked to consult upon for anemia.  Labs in May 2023 showed a low hemoglobin of 10.2, with an MCV of 82.9.  According to the patient, she has had anemia for quite some time.  However, she does not recall anything being done in the past to officially address her anemia.  She claims that her menstrual cycles last for as long as 1 week.  She occasionally sees clots with her menstrual cycles.  Outside of her cycles, the patient claims she has had hematuria for the past 3 days.  To her knowledge there is no family history of anemia or other hematologic disorders.  Of note, the patient does have a history of rheumatoid arthritis.  For the past 2 years, she has been taking both methotrexate and hydroxychloroquine for management.    PAST MEDICAL HISTORY:   Past Medical History:  Diagnosis Date   Anemia    Anxiety    Depression    GDM (gestational diabetes mellitus)    GERD (gastroesophageal reflux disease)    Gestational diabetes    diet controlled   Hx of varicella    Malignant brain tumor [facility])    age 52 treated with chemo and rad   Rheumatoid arthritis ([place])    Thyroid disease     PAST SURGICAL HISTORY:   Past Surgical History:  Procedure Laterality Date   BRAIN SURGERY  10/08/1993   BRAIN TUMOR EXCISION  10/08/1993   CESAREAN SECTION N/A 07/22/2014   Procedure: CESAREAN SECTION;  Surgeon: Logan Bores, MD;  Location: [facility];  Service: Obstetrics;  Laterality: N/A;   CRANIOTOMY     MASTOIDECTOMY Left    FAT GRAFT HARVEST   SUBTOTAL PETROUSECTOMY Left    WISDOM TOOTH EXTRACTION      CURRENT MEDICATIONS:   Current Outpatient Medications  Medication Sig Dispense Refill   b complex vitamins tablet Take 1 tablet by mouth daily.      Cholecalciferol (VITAMIN D PO) Take 1 Dose by mouth daily.     DULoxetine (CYMBALTA) 30 MG capsule Take 1 tablet PO in the morning and 2 tablets at bedtime 270 capsule 2   gabapentin (NEURONTIN) 100 MG capsule Take 100 mg by mouth 3 (three) times daily.     HUMIRA 40 MG/0.8ML PSKT 04/22/17 once every other week  2   ibuprofen (ADVIL,MOTRIN) 600 MG tablet Take 1 tablet (600 mg total) by mouth every 6 (six) hours as needed. 30 tablet 0   MAGNESIUM PO Take 500 mg by mouth daily.     meloxicam (MOBIC) 15 MG tablet TAKE 1 TABLET BY MOUTH DAILY. 90 tablet 1   Omeprazole (PRILOSEC PO) Take 1 tablet by mouth daily.     No current facility-administered medications for this visit.    ALLERGIES:  No Known Allergies  FAMILY HISTORY:   Family History  Problem Relation Age of Onset   Multiple sclerosis Mother    Diabetes Father     SOCIAL HISTORY:  The patient was born and raised in [place].  She lives in [place].  She is divorced, with 1 child.  She is involved in childcare.  There is no history of alcoholism or tobacco abuse.  REVIEW OF SYSTEMS:  Review of Systems  Constitutional:  Negative for fatigue and fever.  HENT:   Positive for hearing loss. Negative for sore throat.   Eyes:  Negative for eye problems.  Respiratory:  Negative for chest tightness, cough and hemoptysis.   Cardiovascular:  Positive for chest pain. Negative for palpitations.  Gastrointestinal:  Positive for constipation and nausea. Negative for abdominal distention, abdominal pain, blood in stool, diarrhea and vomiting.  Endocrine: Negative for hot flashes.  Genitourinary:  Positive for dysuria and hematuria. Negative for difficulty urinating, frequency and nocturia.   Musculoskeletal:  Positive for arthralgias and gait problem. Negative for back pain and myalgias.  Skin: Negative.  Negative for itching and rash.  Neurological:  Positive for gait problem and headaches. Negative for dizziness, extremity  weakness, light-headedness and numbness.  Hematological: Negative.   Psychiatric/Behavioral: Negative.  Negative for depression and suicidal ideas. The patient is not nervous/anxious.     PHYSICAL EXAM:  Blood pressure 110/74, pulse 72, temperature 98.1 F (36.7 C), resp. rate 14, height 4' 11"  (1.499 m), weight 113 lb 8 oz (51.5 kg), SpO2 100 %, unknown if currently breastfeeding. Wt Readings from Last 3 Encounters:  05/25/22 113 lb 8 oz (51.5 kg)  04/22/17 121 lb 12.8 oz (55.2 kg)  01/31/17 125 lb (56.7 kg)   Body mass index is 22.92 kg/m. Performance status (ECOG): 1 - Symptomatic but completely ambulatory Physical Exam Constitutional:      Appearance: Normal appearance. She is not ill-appearing.  HENT:     Mouth/Throat:     Mouth: Mucous membranes are moist.     Pharynx: Oropharynx is clear. No oropharyngeal exudate or posterior oropharyngeal erythema.  Cardiovascular:     Rate and Rhythm: Normal rate and regular rhythm.     Heart sounds: No murmur heard.    No friction rub. No gallop.  Pulmonary:     Effort: Pulmonary effort is normal. No respiratory distress.     Breath sounds: Normal breath sounds. No wheezing, rhonchi or rales.  Abdominal:     General: Bowel sounds are normal. There is no distension.     Palpations: Abdomen is soft. There is no mass.     Tenderness: There is no abdominal tenderness.  Musculoskeletal:        General: No swelling.     Right lower leg: No edema.     Left lower leg: No edema.  Lymphadenopathy:     Cervical: No cervical adenopathy.     Upper Body:     Right upper body: No supraclavicular or axillary adenopathy.     Left upper body: No supraclavicular or axillary adenopathy.     Lower Body: No right inguinal adenopathy. No left inguinal adenopathy.  Skin:    General: Skin is warm.     Coloration: Skin is not jaundiced.     Findings: No lesion or rash.  Neurological:     General: No focal deficit present.     Mental Status: She is  alert and oriented to person, place, and time. Mental status is at baseline.  Psychiatric:        Mood and Affect: Mood normal.        Behavior: Behavior normal.        Thought Content: Thought content normal.    LABS:      Latest Ref Rng & Units 05/25/2022   12:00 AM 01/31/2017   11:37 AM 07/24/2014    9:08 AM  CBC  WBC  4.4     5.5  10.7  Hemoglobin 12.0 - 16.0 11.0     12.1  10.1   Hematocrit 36 - 46 34     36.7  29.6   Platelets 150 - 400 K/uL 275     303  145      This result is from an external source.      Latest Ref Rng & Units 05/25/2022   12:00 AM 01/31/2017   11:37 AM 07/24/2014    9:08 AM  CMP  Glucose 65 - 99 mg/dL  101  75   BUN 4 - 21 9     10  7    Creatinine 0.5 - 1.1 0.8     0.73  0.79   Sodium 137 - 147 139     142  139   Potassium 3.5 - 5.1 mEq/L 3.6     5.2  4.4   Chloride 99 - 108 104     99  102   CO2 13 - 22 29     24  27    Calcium 8.7 - 10.7 9.4     10.1  8.7   Total Protein 6.0 - 8.5 g/dL  7.7  5.0   Total Bilirubin 0.0 - 1.2 mg/dL  0.2  0.2   Alkaline Phos 25 - 125 45     64  88   AST 13 - 35 30     22  26    ALT 7 - 35 U/L 23     21  11       This result is from an external source.    ASSESSMENT & PLAN:  A 36 y.o. female who I was asked to consult upon for anemia.  In clinic today, I informed the patient that her hemoglobin level of 11 is not terribly low.  When reviewing her history, there is a possibility that her normocytic anemia may be due to her methotrexate and hydroxychloroquine, which are being used to treat her rheumatoid arthritis.  Both medications have the ability to cause cytopenias via bone marrow suppression.  For completeness, I will check iron, B12, and folate levels to ensure there are no nutritional deficiencies factoring into her anemia.  A serum protein electrophoresis will also be checked to ensure an underlying plasma cell dyscrasia, such as multiple myeloma, is not factoring into her anemia.  When evaluating her kidney  parameters today, there does not be appear to be any renal insufficiency factoring into her anemia.  Clinically, she appears to be doing okay.  I will see her back in 6 weeks to review all her labs collected today, as well as to reassess her anemia at that time.  The patient understands all the plans discussed today and is in agreement with them.  I do appreciate Melony Overly, MD for his new consult.   Jury Caserta Macarthur Critchley, MD

## 2022-05-25 ENCOUNTER — Inpatient Hospital Stay: Payer: BLUE CROSS/BLUE SHIELD | Attending: Oncology

## 2022-05-25 ENCOUNTER — Inpatient Hospital Stay (INDEPENDENT_AMBULATORY_CARE_PROVIDER_SITE_OTHER): Payer: BLUE CROSS/BLUE SHIELD | Admitting: Oncology

## 2022-05-25 ENCOUNTER — Encounter: Payer: Self-pay | Admitting: Oncology

## 2022-05-25 ENCOUNTER — Other Ambulatory Visit: Payer: No Typology Code available for payment source

## 2022-05-25 ENCOUNTER — Other Ambulatory Visit: Payer: Self-pay | Admitting: Oncology

## 2022-05-25 DIAGNOSIS — M069 Rheumatoid arthritis, unspecified: Secondary | ICD-10-CM | POA: Insufficient documentation

## 2022-05-25 DIAGNOSIS — D649 Anemia, unspecified: Secondary | ICD-10-CM | POA: Insufficient documentation

## 2022-05-25 DIAGNOSIS — Z79899 Other long term (current) drug therapy: Secondary | ICD-10-CM | POA: Diagnosis not present

## 2022-05-25 LAB — HEPATIC FUNCTION PANEL
ALT: 23 U/L (ref 7–35)
AST: 30 (ref 13–35)
Alkaline Phosphatase: 45 (ref 25–125)
Bilirubin, Total: 0.7

## 2022-05-25 LAB — BASIC METABOLIC PANEL
BUN: 9 (ref 4–21)
CO2: 29 — AB (ref 13–22)
Chloride: 104 (ref 99–108)
Creatinine: 0.8 (ref 0.5–1.1)
Glucose: 79
Potassium: 3.6 mEq/L (ref 3.5–5.1)
Sodium: 139 (ref 137–147)

## 2022-05-25 LAB — CBC AND DIFFERENTIAL
HCT: 34 — AB (ref 36–46)
Hemoglobin: 11 — AB (ref 12.0–16.0)
Neutrophils Absolute: 3.39
Platelets: 275 10*3/uL (ref 150–400)
WBC: 4.4

## 2022-05-25 LAB — IRON AND TIBC
Iron: 171 ug/dL — ABNORMAL HIGH (ref 28–170)
Saturation Ratios: 37 % — ABNORMAL HIGH (ref 10.4–31.8)
TIBC: 458 ug/dL — ABNORMAL HIGH (ref 250–450)
UIBC: 287 ug/dL

## 2022-05-25 LAB — FERRITIN: Ferritin: 12 ng/mL (ref 11–307)

## 2022-05-25 LAB — COMPREHENSIVE METABOLIC PANEL
Albumin: 4.6 (ref 3.5–5.0)
Calcium: 9.4 (ref 8.7–10.7)

## 2022-05-25 LAB — FOLATE: Folate: >40 ng/mL (ref >5.9)

## 2022-05-25 LAB — VITAMIN B12: Vitamin B-12: 301 pg/mL (ref 180–914)

## 2022-05-25 LAB — CBC: RBC: 4.03 (ref 3.87–5.11)

## 2022-05-26 LAB — SOLUBLE TRANSFERRIN RECEPTOR: Transferrin Receptor: 25.6 nmol/L (ref 12.2–27.3)

## 2022-05-29 LAB — PROTEIN ELECTROPHORESIS, SERUM
A/G Ratio: 1.3 (ref 0.7–1.7)
Albumin ELP: 3.9 g/dL (ref 2.9–4.4)
Alpha-1-Globulin: 0.2 g/dL (ref 0.0–0.4)
Alpha-2-Globulin: 0.6 g/dL (ref 0.4–1.0)
Beta Globulin: 0.9 g/dL (ref 0.7–1.3)
Gamma Globulin: 1.3 g/dL (ref 0.4–1.8)
Globulin, Total: 3.1 g/dL (ref 2.2–3.9)
Total Protein ELP: 7 g/dL (ref 6.0–8.5)

## 2022-06-01 ENCOUNTER — Encounter: Payer: Self-pay | Admitting: Oncology

## 2022-06-01 ENCOUNTER — Other Ambulatory Visit: Payer: No Typology Code available for payment source

## 2022-07-05 NOTE — Progress Notes (Signed)
Autumn Mccoy  [address] [place],  [place]  [address] [phone]  Clinic Day:  07/03/22  Referring physician: Algis Greenhouse, MD  HISTORY OF PRESENT ILLNESS:  The patient is a 36 y.o. female  who I recently began seeing for anemia.  She comes in today to go over all of her recent labs to determine the etiology behind this.  Since her last visit, the patient has been doing well.  She continues to deny having any overt forms of blood loss to explain her anemia.  PHYSICAL EXAM:  Blood pressure 112/71, pulse 82, temperature (!) 97.5 F (36.4 C), resp. rate 16, height '4\' 11"'$  (1.499 m), weight 113 lb 3.2 oz (51.3 kg), SpO2 99 %, unknown if currently breastfeeding. Wt Readings from Last 3 Encounters:  07/06/22 113 lb 3.2 oz (51.3 kg)  05/25/22 113 lb 8 oz (51.5 kg)  04/22/17 121 lb 12.8 oz (55.2 kg)   Body mass index is 22.86 kg/m. Performance status (ECOG): 1 - Symptomatic but completely ambulatory Physical Exam Constitutional:      Appearance: Normal appearance. She is not ill-appearing.  HENT:     Mouth/Throat:     Mouth: Mucous membranes are moist.     Pharynx: Oropharynx is clear. No oropharyngeal exudate or posterior oropharyngeal erythema.  Cardiovascular:     Rate and Rhythm: Normal rate and regular rhythm.     Heart sounds: No murmur heard.    No friction rub. No gallop.  Pulmonary:     Effort: Pulmonary effort is normal. No respiratory distress.     Breath sounds: Normal breath sounds. No wheezing, rhonchi or rales.  Abdominal:     General: Bowel sounds are normal. There is no distension.     Palpations: Abdomen is soft. There is no mass.     Tenderness: There is no abdominal tenderness.  Musculoskeletal:        General: No swelling.     Right lower leg: No edema.     Left lower leg: No edema.  Lymphadenopathy:     Cervical: No cervical adenopathy.     Upper Body:     Right upper body: No supraclavicular or axillary  adenopathy.     Left upper body: No supraclavicular or axillary adenopathy.     Lower Body: No right inguinal adenopathy. No left inguinal adenopathy.  Skin:    General: Skin is warm.     Coloration: Skin is not jaundiced.     Findings: No lesion or rash.  Neurological:     General: No focal deficit present.     Mental Status: She is alert and oriented to person, place, and time. Mental status is at baseline.  Psychiatric:        Mood and Affect: Mood normal.        Behavior: Behavior normal.        Thought Content: Thought content normal.    LABS:      Latest Ref Rng & Units 07/06/2022   12:00 AM 05/25/2022   12:00 AM 01/31/2017   11:37 AM  CBC  WBC  5.8     4.4     5.5   Hemoglobin 12.0 - 16.0 11.7     11.0     12.1   Hematocrit 36 - 46 35     34     36.7   Platelets 150 - 400 K/uL 298     275     303  This result is from an external source.      Latest Ref Rng & Units 05/25/2022   12:00 AM 01/31/2017   11:37 AM 07/24/2014    9:08 AM  CMP  Glucose 65 - 99 mg/dL  101  75   BUN 4 - '21 9     10  7   '$ Creatinine 0.5 - 1.1 0.8     0.73  0.79   Sodium 137 - 147 139     142  139   Potassium 3.5 - 5.1 mEq/L 3.6     5.2  4.4   Chloride 99 - 108 104     99  102   CO2 13 - '22 29     24  27   '$ Calcium 8.7 - 10.7 9.4     10.1  8.7   Total Protein 6.0 - 8.5 g/dL  7.7  5.0   Total Bilirubin 0.0 - 1.2 mg/dL  0.2  0.2   Alkaline Phos 25 - 125 45     64  88   AST 13 - 35 '30     22  26   '$ ALT 7 - 35 U/L '23     21  11      '$ This result is from an external source.    Latest Reference Range & Units 05/25/22 10:22   Iron 28 - 170 ug/dL 171 (H)   UIBC ug/dL 287   TIBC 250 - 450 ug/dL 458 (H)   Saturation Ratios 10.4 - 31.8 % 37 (H)   Ferritin 11 - 307 ng/mL 12   Folate >5.9 ng/mL >40.0   Transferrin Receptor 12.2 - 27.3 nmol/L 25.6   Vitamin B12 180 - 914 pg/mL 301   Total Protein ELP 6.0 - 8.5 g/dL 7.0   Albumin ELP 2.9 - 4.4 g/dL 3.9   Globulin, Total 2.2 - 3.9 g/dL 3.1 (C)    A/G Ratio 0.7 - 1.7  1.3 (C)   Alpha-1-Globulin 0.0 - 0.4 g/dL 0.2   Alpha-2-Globulin 0.4 - 1.0 g/dL 0.6   Beta Globulin 0.7 - 1.3 g/dL 0.9   Gamma Globulin 0.4 - 1.8 g/dL 1.3   M-SPIKE, % Not Observed g/dL Not Observed              ASSESSMENT & PLAN:  A 36 y.o. female who I recently began seeing for anemia.  In clinic today, I reviewed all of her recent labs with her.  When evaluating her iron parameters, her borderline low ferritin and elevated TIBC suggests a component of iron deficiency being present.  As she has had problems tolerating oral iron in the past, I will arrange for her to receive IV iron to bolster her iron stores and hopefully improve her hemoglobin over time.  Otherwise, I will see her back in 4 months to reassess her iron and hemoglobin levels to see how well she responded to her upcoming IV iron.  The patient understands all the plans discussed today and is in agreement with them.  Autumn Demarinis Macarthur Critchley, MD

## 2022-07-06 ENCOUNTER — Inpatient Hospital Stay: Payer: BLUE CROSS/BLUE SHIELD | Attending: Oncology | Admitting: Oncology

## 2022-07-06 ENCOUNTER — Inpatient Hospital Stay: Payer: BLUE CROSS/BLUE SHIELD

## 2022-07-06 ENCOUNTER — Telehealth: Payer: Self-pay | Admitting: Oncology

## 2022-07-06 ENCOUNTER — Other Ambulatory Visit: Payer: Self-pay | Admitting: Oncology

## 2022-07-06 DIAGNOSIS — D649 Anemia, unspecified: Secondary | ICD-10-CM

## 2022-07-06 DIAGNOSIS — D508 Other iron deficiency anemias: Secondary | ICD-10-CM

## 2022-07-06 LAB — CBC AND DIFFERENTIAL
HCT: 35 — AB (ref 36–46)
Hemoglobin: 11.7 — AB (ref 12.0–16.0)
Neutrophils Absolute: 4.23
Platelets: 298 10*3/uL (ref 150–400)
WBC: 5.8

## 2022-07-06 LAB — CBC: RBC: 4.11 (ref 3.87–5.11)

## 2022-07-06 NOTE — Telephone Encounter (Signed)
07/06/22 Next appt scheduled and confirmed with patient 

## 2022-07-08 DIAGNOSIS — R519 Headache, unspecified: Secondary | ICD-10-CM | POA: Diagnosis not present

## 2022-07-08 DIAGNOSIS — D509 Iron deficiency anemia, unspecified: Secondary | ICD-10-CM | POA: Insufficient documentation

## 2022-07-08 DIAGNOSIS — R110 Nausea: Secondary | ICD-10-CM | POA: Diagnosis not present

## 2022-07-18 ENCOUNTER — Other Ambulatory Visit: Payer: Self-pay | Admitting: Oncology

## 2022-08-03 DIAGNOSIS — M8738 Other secondary osteonecrosis, other site: Secondary | ICD-10-CM | POA: Diagnosis not present

## 2022-08-03 DIAGNOSIS — Z9889 Other specified postprocedural states: Secondary | ICD-10-CM | POA: Diagnosis not present

## 2022-08-03 DIAGNOSIS — Q165 Congenital malformation of inner ear: Secondary | ICD-10-CM | POA: Diagnosis not present

## 2022-08-03 DIAGNOSIS — Y842 Radiological procedure and radiotherapy as the cause of abnormal reaction of the patient, or of later complication, without mention of misadventure at the time of the procedure: Secondary | ICD-10-CM | POA: Diagnosis not present

## 2022-08-13 DIAGNOSIS — M0579 Rheumatoid arthritis with rheumatoid factor of multiple sites without organ or systems involvement: Secondary | ICD-10-CM | POA: Diagnosis not present

## 2022-08-13 DIAGNOSIS — Z79899 Other long term (current) drug therapy: Secondary | ICD-10-CM | POA: Diagnosis not present

## 2022-08-13 DIAGNOSIS — Z87898 Personal history of other specified conditions: Secondary | ICD-10-CM | POA: Diagnosis not present

## 2022-08-13 DIAGNOSIS — Z86718 Personal history of other venous thrombosis and embolism: Secondary | ICD-10-CM | POA: Diagnosis not present

## 2022-08-17 ENCOUNTER — Ambulatory Visit: Payer: No Typology Code available for payment source

## 2022-08-24 ENCOUNTER — Ambulatory Visit: Payer: No Typology Code available for payment source

## 2022-09-08 ENCOUNTER — Other Ambulatory Visit: Payer: Self-pay

## 2022-09-08 ENCOUNTER — Emergency Department
Admission: EM | Admit: 2022-09-08 | Discharge: 2022-09-08 | Disposition: A | Discharge status: Home / Self Care | Payer: BLUE CROSS/BLUE SHIELD | Attending: Emergency Medicine | Admitting: Emergency Medicine

## 2022-09-08 ENCOUNTER — Emergency Department: Payer: BLUE CROSS/BLUE SHIELD

## 2022-09-08 DIAGNOSIS — R519 Headache, unspecified: Secondary | ICD-10-CM | POA: Diagnosis not present

## 2022-09-08 DIAGNOSIS — R9431 Abnormal electrocardiogram [ECG] [EKG]: Secondary | ICD-10-CM | POA: Diagnosis not present

## 2022-09-08 DIAGNOSIS — R42 Dizziness and giddiness: Secondary | ICD-10-CM | POA: Insufficient documentation

## 2022-09-08 DIAGNOSIS — M542 Cervicalgia: Secondary | ICD-10-CM | POA: Insufficient documentation

## 2022-09-08 DIAGNOSIS — Z1152 Encounter for screening for COVID-19: Secondary | ICD-10-CM | POA: Diagnosis not present

## 2022-09-08 LAB — BASIC METABOLIC PANEL
Anion gap: 6 (ref 5–15)
BUN: 17 mg/dL (ref 6–20)
CO2: 26 mmol/L (ref 22–32)
Calcium: 9.4 mg/dL (ref 8.9–10.3)
Chloride: 107 mmol/L (ref 98–111)
Creatinine, Ser: 0.72 mg/dL (ref 0.44–1.00)
GFR, Estimated: >60 mL/min (ref >60)
Glucose, Bld: 96 mg/dL (ref 70–99)
Potassium: 3.9 mmol/L (ref 3.5–5.1)
Sodium: 139 mmol/L (ref 135–145)

## 2022-09-08 LAB — URINALYSIS, ROUTINE W REFLEX MICROSCOPIC
Bilirubin Urine: NEGATIVE
Glucose, UA: NEGATIVE mg/dL
Ketones, ur: NEGATIVE mg/dL
Nitrite: NEGATIVE
Protein, ur: NEGATIVE mg/dL
Specific Gravity, Urine: 1.024 (ref 1.005–1.030)
pH: 5 (ref 5.0–8.0)

## 2022-09-08 LAB — CBC
HCT: 33.5 % — ABNORMAL LOW (ref 36.0–46.0)
Hemoglobin: 11.1 g/dL — ABNORMAL LOW (ref 12.0–15.0)
MCH: 28.8 pg (ref 26.0–34.0)
MCHC: 33.1 g/dL (ref 30.0–36.0)
MCV: 86.8 fL (ref 80.0–100.0)
Platelets: 265 10*3/uL (ref 150–400)
RBC: 3.86 MIL/uL — ABNORMAL LOW (ref 3.87–5.11)
RDW: 15.3 % (ref 11.5–15.5)
WBC: 4.2 10*3/uL (ref 4.0–10.5)
nRBC: 0 % (ref 0.0–0.2)

## 2022-09-08 LAB — POC URINE PREG, ED: Preg Test, Ur: NEGATIVE

## 2022-09-08 LAB — RESP PANEL BY RT-PCR (FLU A&B, COVID) ARPGX2
Influenza A by PCR: NEGATIVE
Influenza B by PCR: NEGATIVE
SARS Coronavirus 2 by RT PCR: NEGATIVE

## 2022-09-08 MED ORDER — PROCHLORPERAZINE MALEATE 10 MG PO TABS: 10.0000 mg | ORAL_TABLET | Freq: Four times a day (QID) | ORAL | 0 refills | Status: AC | PRN

## 2022-09-08 MED ORDER — PROCHLORPERAZINE MALEATE 10 MG PO TABS: 10.0000 mg | ORAL_TABLET | Freq: Four times a day (QID) | ORAL | 0 refills | Status: DC | PRN

## 2022-09-08 MED ORDER — KETOROLAC TROMETHAMINE 30 MG/ML IJ SOLN
30.0000 mg | Freq: Once | INTRAMUSCULAR | Status: AC
  Administered 2022-09-08: 30 mg via INTRAMUSCULAR
  Filled 2022-09-08: qty 1

## 2022-09-08 NOTE — ED Triage Notes (Addendum)
Pt reports headache, neck pain, and tasting salt for a couple of weeks. Pt reports her MD told her to come get checked for meningitis. Denies fevers or recent exposures.

## 2022-09-08 NOTE — ED Provider Notes (Signed)
[facility] Provider Note    Event Date/Time   First MD Initiated Contact with Patient 09/08/22 1144     (approximate)   History   Chief Complaint Headache and Dizziness   HPI  Autumn Mccoy is a 36 y.o. female with past medical history of posterior fossa medulloblastoma status postresection as well as rheumatoid arthritis who presents to the ED complaining of headache.  Patient reports that she has been dealing with waxing and waning constant headache over the past 2 weeks.  She states that it affects both sides of her head and describes it as a throbbing.  This been associated with pain on both sides of her neck when she turns her head, but she denies any recent trauma to her head or neck.  She has not had any fevers, has been treating symptoms with Tylenol and ibuprofen without significant relief.  She has occasional photophobia and nausea, has not vomited.  She denies any vision changes, speech changes, numbness, or weakness.  She does report history of migraines in the past.  She continues to follow with ENT at [facility], spoke with nurse there 2 days ago about her headaches, who recommended she come to the ED for further evaluation of possible meningitis.  She does report that she tastes salt in her mouth since the onset of headaches.     Physical Exam   Triage Vital Signs: ED Triage Vitals  Enc Vitals Group     BP 09/08/22 0915 123/82     Pulse Rate 09/08/22 0915 98     Resp 09/08/22 0915 20     Temp 09/08/22 0915 98.1 F (36.7 C)     Temp Source 09/08/22 0915 Oral     SpO2 09/08/22 0915 100 %     Weight 09/08/22 0914 112 lb 7 oz (51 kg)     Height 09/08/22 0914 '4\' 11"'$  (1.499 m)     Head Circumference --      Peak Flow --      Pain Score 09/08/22 0914 10     Pain Loc --      Pain Edu? --      Excl. in [place]? --     Most recent vital signs: Vitals:   09/08/22 0915  BP: 123/82  Pulse: 98  Resp: 20  Temp: 98.1 F (36.7 C)  SpO2:  100%    Constitutional: Alert and oriented. Eyes: Conjunctivae are normal. Head: Atraumatic.  Postsurgical changes to area of left mastoid with no erythema or warmth noted.  No erythema, edema, warmth, or tenderness noted at right mastoid. Nose: No congestion/rhinnorhea. Mouth/Throat: Mucous membranes are moist.  Neck: Supple with no meningismus. Cardiovascular: Normal rate, regular rhythm. Grossly normal heart sounds.  2+ radial pulses bilaterally. Respiratory: Normal respiratory effort.  No retractions. Lungs CTAB. Gastrointestinal: Soft and nontender. No distention. Musculoskeletal: No lower extremity tenderness nor edema.  Neurologic:  Normal speech and language. No gross focal neurologic deficits are appreciated.    ED Results / Procedures / Treatments   Labs (all labs ordered are listed, but only abnormal results are displayed) Labs Reviewed  CBC - Abnormal; Notable for the following components:      Result Value   RBC 3.86 (*)    Hemoglobin 11.1 (*)    HCT 33.5 (*)    All other components within normal limits  URINALYSIS, ROUTINE W REFLEX MICROSCOPIC - Abnormal; Notable for the following components:   Color, Urine YELLOW (*)  APPearance HAZY (*)    Hgb urine dipstick SMALL (*)    Leukocytes,Ua LARGE (*)    Bacteria, UA RARE (*)    All other components within normal limits  RESP PANEL BY RT-PCR (FLU A&B, COVID) ARPGX2  BASIC METABOLIC PANEL  POC URINE PREG, ED     EKG  ED ECG REPORT I, Blake Divine, the attending physician, personally viewed and interpreted this ECG.   Date: 09/08/2022  EKG Time: 11:50  Rate: 65  Rhythm: normal sinus rhythm, sinus arrhythmia  Axis: Normal  Intervals:none  ST&T Change: None  RADIOLOGY CT head reviewed and interpreted by me with no hemorrhage or midline shift.  PROCEDURES:  Critical Care performed: No  Procedures   MEDICATIONS ORDERED IN ED: Medications  ketorolac (TORADOL) 30 MG/ML injection 30 mg (30 mg  Intramuscular Given 09/08/22 1221)     IMPRESSION / MDM / ASSESSMENT AND PLAN / ED COURSE  I reviewed the triage vital signs and the nursing notes.                              36 y.o. female with past medical history of posterior fossa medulloblastoma status postresection and rheumatoid arthritis who presents to the ED with 2 weeks of headaches and bilateral neck pain.  Patient's presentation is most consistent with acute presentation with potential threat to life or bodily function.  Differential diagnosis includes, but is not limited to, bacterial meningitis, viral meningitis, intracranial hemorrhage, tension headache, migraine headache, mastoiditis.  Patient well-appearing and in no acute distress, vital signs are unremarkable.  She has no meningismus whatsoever on exam and has a nonfocal neurologic exam.  Given 2 weeks of symptoms with no fever or meningismus, very low suspicion for either bacterial or viral meningitis at this time and do not feel lumbar puncture is indicated.  Patient does report history of migraine headaches, was offered migraine cocktail but declines as she drove herself here and we will treat with IM Toradol.  CT imaging of her head is negative for acute process, labs are reassuring with no significant anemia, leukocytosis, electrode abnormality, or AKI.  Pregnancy testing is negative, urinalysis appears to be a contaminated sample but no symptoms to suggest UTI.  I did review her recent MRI imaging at [facility] at the end of October, did show opacification of the right mastoid air cells similar to previous.  No evidence of mastoiditis on exam today, low suspicion for acute CSF leak given chronicity of imaging findings and symptoms.  Patient reports feeling better following IM Toradol and is requesting to be discharged home.  She was provided with referral to neurology, will be prescribed small amount of Compazine to use as needed for ongoing headache.  She was  counseled to follow-up with her ENT provider in [facility] and to return to the ED for new or worsening symptoms.  Patient agrees with plan.      FINAL CLINICAL IMPRESSION(S) / ED DIAGNOSES   Final diagnoses:  Nonintractable episodic headache, unspecified headache type     Rx / DC Orders   ED Discharge Orders          Ordered    prochlorperazine (COMPAZINE) 10 MG tablet  Every 6 hours PRN        09/08/22 1305             Note:  This document was prepared using Dragon voice recognition software and may include unintentional  dictation errors.   Blake Divine, MD 09/08/22 [phone]

## 2022-09-08 NOTE — ED Provider Triage Note (Signed)
Emergency Medicine Provider Triage Evaluation Note  Autumn Mccoy , a 36 y.o. female  was evaluated in triage.  Pt complains of headache.  Sent by doctor to have meningitis ruled out.  Headache for 5 days.  Patient states she has had 1 mastoid surgery and might need another.  Is had dizziness.  Patient's doctor became concerned as she is starting to taste salt.  Review of Systems  Positive:  Negative:   Physical Exam  BP 123/82 (BP Location: Left Arm)   Pulse 98   Temp 98.1 F (36.7 C) (Oral)   Resp 20   Ht '4\' 11"'$  (1.499 m)   Wt 51 kg   LMP 07/12/2022   SpO2 100%   Breastfeeding No   BMI 22.71 kg/m  Gen:   Awake, no distress   Resp:  Normal effort  MSK:   Moves extremities without difficulty  Other:    Medical Decision Making  Medically screening exam initiated at 9:22 AM.  Appropriate orders placed.  Autumn Mccoy was informed that the remainder of the evaluation will be completed by another provider, this initial triage assessment does not replace that evaluation, and the importance of remaining in the ED until their evaluation is complete.  Labs and CT of the head ordered.   Versie Starks, PA-C 09/08/22 [phone]

## 2022-09-26 DIAGNOSIS — H6041 Cholesteatoma of right external ear: Secondary | ICD-10-CM | POA: Diagnosis not present

## 2022-09-26 DIAGNOSIS — M272 Inflammatory conditions of jaws: Secondary | ICD-10-CM | POA: Diagnosis not present

## 2022-09-26 DIAGNOSIS — H90A31 Mixed conductive and sensorineural hearing loss, unilateral, right ear with restricted hearing on the contralateral side: Secondary | ICD-10-CM | POA: Diagnosis not present

## 2022-09-26 DIAGNOSIS — H61892 Other specified disorders of left external ear: Secondary | ICD-10-CM | POA: Diagnosis not present

## 2022-09-26 DIAGNOSIS — Z85841 Personal history of malignant neoplasm of brain: Secondary | ICD-10-CM | POA: Diagnosis not present

## 2022-09-26 DIAGNOSIS — Z9221 Personal history of antineoplastic chemotherapy: Secondary | ICD-10-CM | POA: Diagnosis not present

## 2022-09-26 DIAGNOSIS — Z9889 Other specified postprocedural states: Secondary | ICD-10-CM | POA: Diagnosis not present

## 2022-09-26 DIAGNOSIS — Q018 Encephalocele of other sites: Secondary | ICD-10-CM | POA: Diagnosis not present

## 2022-09-26 DIAGNOSIS — Z974 Presence of external hearing-aid: Secondary | ICD-10-CM | POA: Diagnosis not present

## 2022-09-26 DIAGNOSIS — H7192 Unspecified cholesteatoma, left ear: Secondary | ICD-10-CM | POA: Diagnosis not present

## 2022-09-26 DIAGNOSIS — Z923 Personal history of irradiation: Secondary | ICD-10-CM | POA: Diagnosis not present

## 2022-09-26 DIAGNOSIS — Z86718 Personal history of other venous thrombosis and embolism: Secondary | ICD-10-CM | POA: Diagnosis not present

## 2022-09-26 DIAGNOSIS — I82402 Acute embolism and thrombosis of unspecified deep veins of left lower extremity: Secondary | ICD-10-CM | POA: Diagnosis not present

## 2022-09-26 DIAGNOSIS — Z9089 Acquired absence of other organs: Secondary | ICD-10-CM | POA: Diagnosis not present

## 2022-09-26 DIAGNOSIS — H74322 Partial loss of ear ossicles, left ear: Secondary | ICD-10-CM | POA: Diagnosis not present

## 2022-09-26 DIAGNOSIS — H903 Sensorineural hearing loss, bilateral: Secondary | ICD-10-CM | POA: Diagnosis not present

## 2022-09-26 DIAGNOSIS — H6042 Cholesteatoma of left external ear: Secondary | ICD-10-CM | POA: Diagnosis not present

## 2022-09-26 DIAGNOSIS — M8738 Other secondary osteonecrosis, other site: Secondary | ICD-10-CM | POA: Diagnosis not present

## 2022-10-02 DIAGNOSIS — Z79899 Other long term (current) drug therapy: Secondary | ICD-10-CM | POA: Diagnosis not present

## 2022-10-02 DIAGNOSIS — M0579 Rheumatoid arthritis with rheumatoid factor of multiple sites without organ or systems involvement: Secondary | ICD-10-CM | POA: Diagnosis not present

## 2022-11-05 ENCOUNTER — Encounter: Payer: Self-pay | Admitting: Oncology

## 2022-11-05 ENCOUNTER — Other Ambulatory Visit: Payer: No Typology Code available for payment source

## 2022-11-05 ENCOUNTER — Ambulatory Visit: Payer: No Typology Code available for payment source | Admitting: Oncology

## 2022-12-05 DIAGNOSIS — H6042 Cholesteatoma of left external ear: Secondary | ICD-10-CM | POA: Diagnosis not present

## 2022-12-05 DIAGNOSIS — Z923 Personal history of irradiation: Secondary | ICD-10-CM | POA: Diagnosis not present

## 2022-12-05 DIAGNOSIS — H6041 Cholesteatoma of right external ear: Secondary | ICD-10-CM | POA: Diagnosis not present

## 2022-12-05 DIAGNOSIS — Z9889 Other specified postprocedural states: Secondary | ICD-10-CM | POA: Diagnosis not present

## 2022-12-05 DIAGNOSIS — Z974 Presence of external hearing-aid: Secondary | ICD-10-CM | POA: Diagnosis not present

## 2022-12-05 DIAGNOSIS — Z86718 Personal history of other venous thrombosis and embolism: Secondary | ICD-10-CM | POA: Diagnosis not present

## 2022-12-05 DIAGNOSIS — Q018 Encephalocele of other sites: Secondary | ICD-10-CM | POA: Diagnosis not present

## 2022-12-05 DIAGNOSIS — H9072 Mixed conductive and sensorineural hearing loss, unilateral, left ear, with unrestricted hearing on the contralateral side: Secondary | ICD-10-CM | POA: Diagnosis not present

## 2022-12-05 DIAGNOSIS — C716 Malignant neoplasm of cerebellum: Secondary | ICD-10-CM | POA: Diagnosis not present

## 2022-12-05 DIAGNOSIS — H903 Sensorineural hearing loss, bilateral: Secondary | ICD-10-CM | POA: Diagnosis not present

## 2022-12-05 DIAGNOSIS — Z85841 Personal history of malignant neoplasm of brain: Secondary | ICD-10-CM | POA: Diagnosis not present

## 2022-12-05 DIAGNOSIS — M8738 Other secondary osteonecrosis, other site: Secondary | ICD-10-CM | POA: Diagnosis not present

## 2022-12-05 DIAGNOSIS — H938X1 Other specified disorders of right ear: Secondary | ICD-10-CM | POA: Diagnosis not present

## 2022-12-05 DIAGNOSIS — Z9221 Personal history of antineoplastic chemotherapy: Secondary | ICD-10-CM | POA: Diagnosis not present

## 2023-01-25 DIAGNOSIS — M0579 Rheumatoid arthritis with rheumatoid factor of multiple sites without organ or systems involvement: Secondary | ICD-10-CM | POA: Diagnosis not present

## 2023-01-25 DIAGNOSIS — Z79899 Other long term (current) drug therapy: Secondary | ICD-10-CM | POA: Diagnosis not present

## 2023-01-28 DIAGNOSIS — Z86718 Personal history of other venous thrombosis and embolism: Secondary | ICD-10-CM | POA: Diagnosis not present

## 2023-03-12 DIAGNOSIS — C716 Malignant neoplasm of cerebellum: Secondary | ICD-10-CM | POA: Diagnosis not present

## 2023-03-12 DIAGNOSIS — H6042 Cholesteatoma of left external ear: Secondary | ICD-10-CM | POA: Diagnosis not present

## 2023-03-12 DIAGNOSIS — Q165 Congenital malformation of inner ear: Secondary | ICD-10-CM | POA: Diagnosis not present

## 2023-03-12 DIAGNOSIS — H903 Sensorineural hearing loss, bilateral: Secondary | ICD-10-CM | POA: Diagnosis not present

## 2023-03-12 DIAGNOSIS — Z85841 Personal history of malignant neoplasm of brain: Secondary | ICD-10-CM | POA: Diagnosis not present

## 2023-03-12 DIAGNOSIS — Y842 Radiological procedure and radiotherapy as the cause of abnormal reaction of the patient, or of later complication, without mention of misadventure at the time of the procedure: Secondary | ICD-10-CM | POA: Diagnosis not present

## 2023-03-12 DIAGNOSIS — M8738 Other secondary osteonecrosis, other site: Secondary | ICD-10-CM | POA: Diagnosis not present

## 2023-03-15 ENCOUNTER — Encounter: Payer: Self-pay | Admitting: Oncology

## 2023-03-15 ENCOUNTER — Other Ambulatory Visit: Payer: Self-pay | Admitting: Otolaryngology

## 2023-03-15 DIAGNOSIS — E010 Iodine-deficiency related diffuse (endemic) goiter: Secondary | ICD-10-CM

## 2023-03-15 DIAGNOSIS — E041 Nontoxic single thyroid nodule: Secondary | ICD-10-CM | POA: Diagnosis not present

## 2023-03-19 ENCOUNTER — Ambulatory Visit
Admission: RE | Admit: 2023-03-19 | Discharge: 2023-03-19 | Disposition: A | Discharge status: Home / Self Care | Payer: BLUE CROSS/BLUE SHIELD | Source: Ambulatory Visit | Attending: Otolaryngology | Admitting: Otolaryngology

## 2023-03-19 DIAGNOSIS — E041 Nontoxic single thyroid nodule: Secondary | ICD-10-CM | POA: Diagnosis not present

## 2023-03-19 DIAGNOSIS — E010 Iodine-deficiency related diffuse (endemic) goiter: Secondary | ICD-10-CM | POA: Diagnosis not present

## 2023-05-26 DIAGNOSIS — R051 Acute cough: Secondary | ICD-10-CM | POA: Diagnosis not present

## 2023-05-26 DIAGNOSIS — J069 Acute upper respiratory infection, unspecified: Secondary | ICD-10-CM | POA: Diagnosis not present

## 2023-05-26 DIAGNOSIS — Z20822 Contact with and (suspected) exposure to covid-19: Secondary | ICD-10-CM | POA: Diagnosis not present

## 2023-06-03 DIAGNOSIS — M79672 Pain in left foot: Secondary | ICD-10-CM | POA: Diagnosis not present

## 2023-06-03 DIAGNOSIS — L84 Corns and callosities: Secondary | ICD-10-CM | POA: Diagnosis not present

## 2023-06-03 DIAGNOSIS — M79671 Pain in right foot: Secondary | ICD-10-CM | POA: Diagnosis not present

## 2023-06-27 DIAGNOSIS — M0579 Rheumatoid arthritis with rheumatoid factor of multiple sites without organ or systems involvement: Secondary | ICD-10-CM | POA: Diagnosis not present

## 2023-06-27 DIAGNOSIS — Z79899 Other long term (current) drug therapy: Secondary | ICD-10-CM | POA: Diagnosis not present

## 2023-06-27 DIAGNOSIS — Z87898 Personal history of other specified conditions: Secondary | ICD-10-CM | POA: Diagnosis not present

## 2023-08-28 DIAGNOSIS — H903 Sensorineural hearing loss, bilateral: Secondary | ICD-10-CM | POA: Diagnosis not present

## 2023-08-28 DIAGNOSIS — H6042 Cholesteatoma of left external ear: Secondary | ICD-10-CM | POA: Diagnosis not present

## 2023-08-28 DIAGNOSIS — C716 Malignant neoplasm of cerebellum: Secondary | ICD-10-CM | POA: Diagnosis not present

## 2023-11-20 DIAGNOSIS — J029 Acute pharyngitis, unspecified: Secondary | ICD-10-CM | POA: Diagnosis not present

## 2023-11-20 DIAGNOSIS — R070 Pain in throat: Secondary | ICD-10-CM | POA: Diagnosis not present

## 2023-11-20 DIAGNOSIS — B348 Other viral infections of unspecified site: Secondary | ICD-10-CM | POA: Diagnosis not present

## 2023-11-20 DIAGNOSIS — J028 Acute pharyngitis due to other specified organisms: Secondary | ICD-10-CM | POA: Diagnosis not present

## 2023-12-03 DIAGNOSIS — Z79899 Other long term (current) drug therapy: Secondary | ICD-10-CM | POA: Diagnosis not present

## 2023-12-03 DIAGNOSIS — M0579 Rheumatoid arthritis with rheumatoid factor of multiple sites without organ or systems involvement: Secondary | ICD-10-CM | POA: Diagnosis not present

## 2023-12-17 DIAGNOSIS — Y842 Radiological procedure and radiotherapy as the cause of abnormal reaction of the patient, or of later complication, without mention of misadventure at the time of the procedure: Secondary | ICD-10-CM | POA: Diagnosis not present

## 2023-12-17 DIAGNOSIS — Q165 Congenital malformation of inner ear: Secondary | ICD-10-CM | POA: Diagnosis not present

## 2023-12-17 DIAGNOSIS — H938X1 Other specified disorders of right ear: Secondary | ICD-10-CM | POA: Diagnosis not present

## 2023-12-17 DIAGNOSIS — M8738 Other secondary osteonecrosis, other site: Secondary | ICD-10-CM | POA: Diagnosis not present

## 2023-12-17 DIAGNOSIS — H903 Sensorineural hearing loss, bilateral: Secondary | ICD-10-CM | POA: Diagnosis not present

## 2024-01-31 DIAGNOSIS — R21 Rash and other nonspecific skin eruption: Secondary | ICD-10-CM | POA: Diagnosis not present

## 2024-02-19 DIAGNOSIS — Z130 Encounter for screening for diseases of the blood and blood-forming organs and certain disorders involving the immune mechanism: Secondary | ICD-10-CM | POA: Diagnosis not present

## 2024-02-19 DIAGNOSIS — Z01419 Encounter for gynecological examination (general) (routine) without abnormal findings: Secondary | ICD-10-CM | POA: Diagnosis not present

## 2024-02-19 DIAGNOSIS — N898 Other specified noninflammatory disorders of vagina: Secondary | ICD-10-CM | POA: Diagnosis not present

## 2024-02-29 ENCOUNTER — Encounter: Payer: Self-pay | Admitting: Oncology

## 2024-02-29 ENCOUNTER — Emergency Department

## 2024-02-29 ENCOUNTER — Other Ambulatory Visit: Payer: Self-pay

## 2024-02-29 ENCOUNTER — Emergency Department
Admission: EM | Admit: 2024-02-29 | Discharge: 2024-02-29 | Disposition: A | Discharge status: Home / Self Care | Attending: Emergency Medicine | Admitting: Emergency Medicine

## 2024-02-29 DIAGNOSIS — L989 Disorder of the skin and subcutaneous tissue, unspecified: Secondary | ICD-10-CM

## 2024-02-29 DIAGNOSIS — Z85841 Personal history of malignant neoplasm of brain: Secondary | ICD-10-CM | POA: Diagnosis not present

## 2024-02-29 DIAGNOSIS — R220 Localized swelling, mass and lump, head: Secondary | ICD-10-CM | POA: Diagnosis not present

## 2024-02-29 DIAGNOSIS — G9389 Other specified disorders of brain: Secondary | ICD-10-CM | POA: Diagnosis not present

## 2024-02-29 DIAGNOSIS — R519 Headache, unspecified: Secondary | ICD-10-CM | POA: Diagnosis not present

## 2024-02-29 MED ORDER — SULFAMETHOXAZOLE-TRIMETHOPRIM 800-160 MG PO TABS: 1.0000 | ORAL_TABLET | Freq: Two times a day (BID) | ORAL | 0 refills | Status: AC

## 2024-02-29 NOTE — ED Provider Notes (Signed)
 [facility] Provider Note    Event Date/Time   First MD Initiated Contact with Patient 02/29/24 1623     (approximate)   History   bump on head   HPI  Autumn Mccoy is a 38 y.o. female who presents to the emergency department today because of concerns for painful bumps to her head.  Patient states that she noticed them yesterday.  She denies any trauma or injury to her head.  She denies fevers or chills.  No associated nausea or vomiting.  States she does have a history of brain cancer status postresection.     Physical Exam   Triage Vital Signs: ED Triage Vitals  Encounter Vitals Group     BP 02/29/24 1526 126/84     Systolic BP Percentile --      Diastolic BP Percentile --      Pulse Rate 02/29/24 1526 79     Resp 02/29/24 1526 16     Temp 02/29/24 1526 97.8 F (36.6 C)     Temp Source 02/29/24 1526 Oral     SpO2 02/29/24 1526 100 %     Weight 02/29/24 1527 122 lb (55.3 kg)     Height 02/29/24 1527 4\' 11"  (1.499 m)     Head Circumference --      Peak Flow --      Pain Score 02/29/24 1525 9     Pain Loc --      Pain Education --      Exclude from Growth Chart --     Most recent vital signs: Vitals:   02/29/24 1526  BP: 126/84  Pulse: 79  Resp: 16  Temp: 97.8 F (36.6 C)  SpO2: 100%   General: Awake, alert, oriented. CV:  Good peripheral perfusion.  Resp:  Normal effort.  Abd:  No distention.  Other:  Area of tenderness and slight bump to left occiput, additional area of tenderness and bump to left parietal scalp. No fluctuance.    ED Results / Procedures / Treatments   Labs (all labs ordered are listed, but only abnormal results are displayed) Labs Reviewed - No data to display   EKG  None   RADIOLOGY I independently interpreted and visualized the CT head. My interpretation: No [facility] Radiology interpretation:  IMPRESSION:  1. No acute intracranial process.  2. Stable postsurgical changes as above.       PROCEDURES:  Critical Care performed: No    MEDICATIONS ORDERED IN ED: Medications - No data to display   IMPRESSION / MDM / ASSESSMENT AND PLAN / ED COURSE  I reviewed the triage vital signs and the nursing notes.                              Differential diagnosis includes, but is not limited to, lymph nodes, cellulitis, neoplasm  Patient's presentation is most consistent with acute presentation with potential threat to life or bodily function.   Patient presents to the emergency department today because of concern for bumps on her head. On exam she does have two areas of her scalp that are slightly raised and tender. Head ct was performed that did not show any acute abnormality. At this time do wonder if it is reactive and I would have concern for infection. Will place on course of antibiotics.     FINAL CLINICAL IMPRESSION(S) / ED DIAGNOSES   Final diagnoses:  Bumps on skin  Rx / DC Orders     Note:  This document was prepared using Dragon voice recognition software and may include unintentional dictation errors.    Autumn Soho, MD 02/29/24 [phone]

## 2024-02-29 NOTE — ED Triage Notes (Signed)
 Pt to ED for 3 "knots on head". Mostly to L side. No injury. Knots ongoing since 2 days ago. No acute distress.

## 2024-03-03 DIAGNOSIS — Z79899 Other long term (current) drug therapy: Secondary | ICD-10-CM | POA: Diagnosis not present

## 2024-03-03 DIAGNOSIS — M0579 Rheumatoid arthritis with rheumatoid factor of multiple sites without organ or systems involvement: Secondary | ICD-10-CM | POA: Diagnosis not present

## 2024-03-03 DIAGNOSIS — Z87898 Personal history of other specified conditions: Secondary | ICD-10-CM | POA: Diagnosis not present

## 2024-03-18 DIAGNOSIS — H6042 Cholesteatoma of left external ear: Secondary | ICD-10-CM | POA: Diagnosis not present

## 2024-03-18 DIAGNOSIS — Y842 Radiological procedure and radiotherapy as the cause of abnormal reaction of the patient, or of later complication, without mention of misadventure at the time of the procedure: Secondary | ICD-10-CM | POA: Diagnosis not present

## 2024-03-18 DIAGNOSIS — H903 Sensorineural hearing loss, bilateral: Secondary | ICD-10-CM | POA: Diagnosis not present

## 2024-03-18 DIAGNOSIS — M8738 Other secondary osteonecrosis, other site: Secondary | ICD-10-CM | POA: Diagnosis not present

## 2024-03-20 DIAGNOSIS — Q019 Encephalocele, unspecified: Secondary | ICD-10-CM | POA: Diagnosis not present

## 2024-03-20 DIAGNOSIS — Y842 Radiological procedure and radiotherapy as the cause of abnormal reaction of the patient, or of later complication, without mention of misadventure at the time of the procedure: Secondary | ICD-10-CM | POA: Diagnosis not present

## 2024-03-20 DIAGNOSIS — Z9012 Acquired absence of left breast and nipple: Secondary | ICD-10-CM | POA: Diagnosis not present

## 2024-03-20 DIAGNOSIS — H748X1 Other specified disorders of right middle ear and mastoid: Secondary | ICD-10-CM | POA: Diagnosis not present

## 2024-03-20 DIAGNOSIS — M5481 Occipital neuralgia: Secondary | ICD-10-CM | POA: Diagnosis not present

## 2024-03-20 DIAGNOSIS — Z9889 Other specified postprocedural states: Secondary | ICD-10-CM | POA: Diagnosis not present

## 2024-04-19 DIAGNOSIS — L259 Unspecified contact dermatitis, unspecified cause: Secondary | ICD-10-CM | POA: Diagnosis not present

## 2024-04-19 DIAGNOSIS — R21 Rash and other nonspecific skin eruption: Secondary | ICD-10-CM | POA: Diagnosis not present

## 2024-04-21 DIAGNOSIS — Y842 Radiological procedure and radiotherapy as the cause of abnormal reaction of the patient, or of later complication, without mention of misadventure at the time of the procedure: Secondary | ICD-10-CM | POA: Diagnosis not present

## 2024-04-21 DIAGNOSIS — H9193 Unspecified hearing loss, bilateral: Secondary | ICD-10-CM | POA: Diagnosis not present

## 2024-04-21 DIAGNOSIS — H90A32 Mixed conductive and sensorineural hearing loss, unilateral, left ear with restricted hearing on the contralateral side: Secondary | ICD-10-CM | POA: Diagnosis not present

## 2024-04-21 DIAGNOSIS — H6121 Impacted cerumen, right ear: Secondary | ICD-10-CM | POA: Diagnosis not present

## 2024-04-21 DIAGNOSIS — Z79899 Other long term (current) drug therapy: Secondary | ICD-10-CM | POA: Diagnosis not present

## 2024-04-21 DIAGNOSIS — H6042 Cholesteatoma of left external ear: Secondary | ICD-10-CM | POA: Diagnosis not present

## 2024-04-21 DIAGNOSIS — M8738 Other secondary osteonecrosis, other site: Secondary | ICD-10-CM | POA: Diagnosis not present

## 2024-04-21 DIAGNOSIS — Z7189 Other specified counseling: Secondary | ICD-10-CM | POA: Diagnosis not present

## 2024-04-21 DIAGNOSIS — Z9221 Personal history of antineoplastic chemotherapy: Secondary | ICD-10-CM | POA: Diagnosis not present

## 2024-04-21 DIAGNOSIS — H903 Sensorineural hearing loss, bilateral: Secondary | ICD-10-CM | POA: Diagnosis not present

## 2024-04-21 DIAGNOSIS — Z9889 Other specified postprocedural states: Secondary | ICD-10-CM | POA: Diagnosis not present

## 2024-04-21 DIAGNOSIS — Z923 Personal history of irradiation: Secondary | ICD-10-CM | POA: Diagnosis not present

## 2024-04-21 DIAGNOSIS — Z0110 Encounter for examination of ears and hearing without abnormal findings: Secondary | ICD-10-CM | POA: Diagnosis not present

## 2024-04-21 DIAGNOSIS — Z86718 Personal history of other venous thrombosis and embolism: Secondary | ICD-10-CM | POA: Diagnosis not present

## 2024-05-21 DIAGNOSIS — R300 Dysuria: Secondary | ICD-10-CM | POA: Diagnosis not present

## 2024-05-21 DIAGNOSIS — N898 Other specified noninflammatory disorders of vagina: Secondary | ICD-10-CM | POA: Diagnosis not present

## 2024-06-05 DIAGNOSIS — K219 Gastro-esophageal reflux disease without esophagitis: Secondary | ICD-10-CM | POA: Diagnosis not present

## 2024-06-05 DIAGNOSIS — Z87898 Personal history of other specified conditions: Secondary | ICD-10-CM | POA: Diagnosis not present

## 2024-06-05 DIAGNOSIS — Z1331 Encounter for screening for depression: Secondary | ICD-10-CM | POA: Diagnosis not present

## 2024-06-05 DIAGNOSIS — M059 Rheumatoid arthritis with rheumatoid factor, unspecified: Secondary | ICD-10-CM | POA: Diagnosis not present

## 2024-06-09 ENCOUNTER — Encounter: Payer: Self-pay | Admitting: Oncology

## 2024-06-09 DIAGNOSIS — M059 Rheumatoid arthritis with rheumatoid factor, unspecified: Secondary | ICD-10-CM | POA: Diagnosis not present

## 2024-06-09 DIAGNOSIS — D649 Anemia, unspecified: Secondary | ICD-10-CM | POA: Diagnosis not present

## 2024-06-09 DIAGNOSIS — Z131 Encounter for screening for diabetes mellitus: Secondary | ICD-10-CM | POA: Diagnosis not present

## 2024-06-09 DIAGNOSIS — Z13220 Encounter for screening for lipoid disorders: Secondary | ICD-10-CM | POA: Diagnosis not present

## 2024-06-09 DIAGNOSIS — Z79899 Other long term (current) drug therapy: Secondary | ICD-10-CM | POA: Diagnosis not present

## 2024-06-09 DIAGNOSIS — E559 Vitamin D deficiency, unspecified: Secondary | ICD-10-CM | POA: Diagnosis not present

## 2024-06-12 ENCOUNTER — Encounter: Payer: Self-pay | Admitting: Oncology

## 2024-06-24 ENCOUNTER — Other Ambulatory Visit: Payer: Self-pay | Admitting: Oncology

## 2024-06-24 DIAGNOSIS — D649 Anemia, unspecified: Secondary | ICD-10-CM

## 2024-06-24 NOTE — Progress Notes (Deleted)
 [facility]  [address] [place],  [place]  [address] [phone]  Clinic Day:  07/03/22  Referring physician: Ofilia Lamar CROME, MD  HISTORY OF PRESENT ILLNESS:  The patient is a 38 y.o. female  who I last saw in October 2025 for iron deficiency anemia.  The plan was for her to receive IV iron, which she never did.  Recent labs showed a low hemoglobin of 10.4.  Her B12 and folate levels were checked, which came back normal.    She comes in today to go over all of her recent labs to determine the etiology behind this.  Since her last visit, the patient has been doing well.  She continues to deny having any overt forms of blood loss to explain her anemia.  PHYSICAL EXAM:  There were no vitals taken for this visit. Wt Readings from Last 3 Encounters:  02/29/24 122 lb (55.3 kg)  09/08/22 112 lb 7 oz (51 kg)  07/06/22 113 lb 3.2 oz (51.3 kg)   There is no height or weight on file to calculate BMI. Performance status (ECOG): 1 - Symptomatic but completely ambulatory Physical Exam Constitutional:      Appearance: Normal appearance. She is not ill-appearing.  HENT:     Mouth/Throat:     Mouth: Mucous membranes are moist.     Pharynx: Oropharynx is clear. No oropharyngeal exudate or posterior oropharyngeal erythema.  Cardiovascular:     Rate and Rhythm: Normal rate and regular rhythm.     Heart sounds: No murmur heard.    No friction rub. No gallop.  Pulmonary:     Effort: Pulmonary effort is normal. No respiratory distress.     Breath sounds: Normal breath sounds. No wheezing, rhonchi or rales.  Abdominal:     General: Bowel sounds are normal. There is no distension.     Palpations: Abdomen is soft. There is no mass.     Tenderness: There is no abdominal tenderness.  Musculoskeletal:        General: No swelling.     Right lower leg: No edema.     Left lower leg: No edema.  Lymphadenopathy:     Cervical: No cervical adenopathy.     Upper  Body:     Right upper body: No supraclavicular or axillary adenopathy.     Left upper body: No supraclavicular or axillary adenopathy.     Lower Body: No right inguinal adenopathy. No left inguinal adenopathy.  Skin:    General: Skin is warm.     Coloration: Skin is not jaundiced.     Findings: No lesion or rash.  Neurological:     General: No focal deficit present.     Mental Status: She is alert and oriented to person, place, and time. Mental status is at baseline.  Psychiatric:        Mood and Affect: Mood normal.        Behavior: Behavior normal.        Thought Content: Thought content normal.    LABS:      Latest Ref Rng & Units 09/08/2022    9:21 AM 07/06/2022   12:00 AM 05/25/2022   12:00 AM  CBC  WBC 4.0 - 10.5 K/uL 4.2  5.8     4.4      Hemoglobin 12.0 - 15.0 g/dL 88.8  88.2     88.9      Hematocrit 36.0 - 46.0 % 33.5  35  34      Platelets 150 - 400 K/uL 265  298     275         This result is from an external source.      Latest Ref Rng & Units 09/08/2022    9:21 AM 05/25/2022   12:00 AM 01/31/2017   11:37 AM  CMP  Glucose 70 - 99 mg/dL 96   898   BUN 6 - 20 mg/dL 17  9     10    Creatinine 0.44 - 1.00 mg/dL 9.27  0.8     9.26   Sodium 135 - 145 mmol/L 139  139     142   Potassium 3.5 - 5.1 mmol/L 3.9  3.6     5.2   Chloride 98 - 111 mmol/L 107  104     99   CO2 22 - 32 mmol/L 26  29     24    Calcium 8.9 - 10.3 mg/dL 9.4  9.4     89.8   Total Protein 6.0 - 8.5 g/dL   7.7   Total Bilirubin 0.0 - 1.2 mg/dL   0.2   Alkaline Phos 25 - 125  45     64   AST 13 - 35  30     22   ALT 7 - 35 U/L  23     21      This result is from an external source.    Latest Reference Range & Units 05/25/22 10:22   Iron 28 - 170 ug/dL 828 (H)   UIBC ug/dL 712   TIBC 749 - 549 ug/dL 541 (H)   Saturation Ratios 10.4 - 31.8 % 37 (H)   Ferritin 11 - 307 ng/mL 12   Folate >5.9 ng/mL >40.0   Transferrin Receptor 12.2 - 27.3 nmol/L 25.6   Vitamin B12 180 - 914 pg/mL 301   Total  Protein ELP 6.0 - 8.5 g/dL 7.0   Albumin ELP 2.9 - 4.4 g/dL 3.9   Globulin, Total 2.2 - 3.9 g/dL 3.1 (C)   A/G Ratio 0.7 - 1.7  1.3 (C)   Alpha-1-Globulin 0.0 - 0.4 g/dL 0.2   Joeyj-7-Honalopw 0.4 - 1.0 g/dL 0.6   Beta Globulin 0.7 - 1.3 g/dL 0.9   Gamma Globulin 0.4 - 1.8 g/dL 1.3   M-SPIKE, % Not Observed g/dL Not Observed              ASSESSMENT & PLAN:  A 38 y.o. female who I recently began seeing for anemia.  In clinic today, I reviewed all of her recent labs with her.  When evaluating her iron parameters, her borderline low ferritin and elevated TIBC suggests a component of iron deficiency being present.  As she has had problems tolerating oral iron in the past, I will arrange for her to receive IV iron to bolster her iron stores and hopefully improve her hemoglobin over time.  Otherwise, I will see her back in 4 months to reassess her iron and hemoglobin levels to see how well she responded to her upcoming IV iron.  The patient understands all the plans discussed today and is in agreement with them.  [place] Se[place] DELENA Kerns, MD

## 2024-06-25 ENCOUNTER — Inpatient Hospital Stay: Admitting: Oncology

## 2024-06-25 ENCOUNTER — Inpatient Hospital Stay: Attending: Oncology

## 2024-06-25 DIAGNOSIS — D509 Iron deficiency anemia, unspecified: Secondary | ICD-10-CM | POA: Insufficient documentation

## 2024-07-06 DIAGNOSIS — D649 Anemia, unspecified: Secondary | ICD-10-CM | POA: Diagnosis not present

## 2024-07-06 DIAGNOSIS — M79671 Pain in right foot: Secondary | ICD-10-CM | POA: Diagnosis not present

## 2024-07-06 DIAGNOSIS — M79672 Pain in left foot: Secondary | ICD-10-CM | POA: Diagnosis not present

## 2024-07-06 DIAGNOSIS — E611 Iron deficiency: Secondary | ICD-10-CM | POA: Diagnosis not present

## 2024-07-06 DIAGNOSIS — Z79899 Other long term (current) drug therapy: Secondary | ICD-10-CM | POA: Diagnosis not present

## 2024-07-06 DIAGNOSIS — M79642 Pain in left hand: Secondary | ICD-10-CM | POA: Diagnosis not present

## 2024-07-06 DIAGNOSIS — M0589 Other rheumatoid arthritis with rheumatoid factor of multiple sites: Secondary | ICD-10-CM | POA: Diagnosis not present

## 2024-07-06 DIAGNOSIS — M79641 Pain in right hand: Secondary | ICD-10-CM | POA: Diagnosis not present

## 2024-07-07 ENCOUNTER — Inpatient Hospital Stay

## 2024-07-07 ENCOUNTER — Telehealth: Payer: Self-pay | Admitting: Oncology

## 2024-07-07 ENCOUNTER — Inpatient Hospital Stay (HOSPITAL_BASED_OUTPATIENT_CLINIC_OR_DEPARTMENT_OTHER): Admitting: Oncology

## 2024-07-07 ENCOUNTER — Other Ambulatory Visit: Payer: Self-pay | Admitting: Oncology

## 2024-07-07 VITALS — BP 101/59 | HR 62 | Temp 97.6°F | Resp 14 | Ht 59.0 in | Wt 120.9 lb

## 2024-07-07 DIAGNOSIS — D649 Anemia, unspecified: Secondary | ICD-10-CM

## 2024-07-07 DIAGNOSIS — D500 Iron deficiency anemia secondary to blood loss (chronic): Secondary | ICD-10-CM

## 2024-07-07 DIAGNOSIS — D509 Iron deficiency anemia, unspecified: Secondary | ICD-10-CM | POA: Diagnosis not present

## 2024-07-07 LAB — CBC WITH DIFFERENTIAL (CANCER CENTER ONLY)
Abs Immature Granulocytes: 0.01 K/uL (ref 0.00–0.07)
Basophils Absolute: 0.1 K/uL (ref 0.0–0.1)
Basophils Relative: 1 %
Eosinophils Absolute: 0.1 K/uL (ref 0.0–0.5)
Eosinophils Relative: 3 %
HCT: 31.3 % — ABNORMAL LOW (ref 36.0–46.0)
Hemoglobin: 10 g/dL — ABNORMAL LOW (ref 12.0–15.0)
Immature Granulocytes: 0 %
Lymphocytes Relative: 32 %
Lymphs Abs: 1.3 K/uL (ref 0.7–4.0)
MCH: 27.1 pg (ref 26.0–34.0)
MCHC: 31.9 g/dL (ref 30.0–36.0)
MCV: 84.8 fL (ref 80.0–100.0)
Monocytes Absolute: 0.3 K/uL (ref 0.1–1.0)
Monocytes Relative: 6 %
Neutro Abs: 2.3 K/uL (ref 1.7–7.7)
Neutrophils Relative %: 58 %
Platelet Count: 262 K/uL (ref 150–400)
RBC: 3.69 MIL/uL — ABNORMAL LOW (ref 3.87–5.11)
RDW: 15.9 % — ABNORMAL HIGH (ref 11.5–15.5)
WBC Count: 4.1 K/uL (ref 4.0–10.5)
nRBC: 0 % (ref 0.0–0.2)

## 2024-07-07 LAB — CMP (CANCER CENTER ONLY)
ALT: 12 U/L (ref 0–44)
AST: 18 U/L (ref 15–41)
Albumin: 4.4 g/dL (ref 3.5–5.0)
Alkaline Phosphatase: 53 U/L (ref 38–126)
Anion gap: 13 (ref 5–15)
BUN: 8 mg/dL (ref 6–20)
CO2: 25 mmol/L (ref 22–32)
Calcium: 9.6 mg/dL (ref 8.9–10.3)
Chloride: 102 mmol/L (ref 98–111)
Creatinine: 0.8 mg/dL (ref 0.44–1.00)
GFR, Estimated: >60 mL/min (ref >60)
Glucose, Bld: 94 mg/dL (ref 70–99)
Potassium: 3.8 mmol/L (ref 3.5–5.1)
Sodium: 139 mmol/L (ref 135–145)
Total Bilirubin: 0.4 mg/dL (ref 0.0–1.2)
Total Protein: 7.8 g/dL (ref 6.5–8.1)

## 2024-07-07 LAB — IRON AND TIBC
Iron: 77 ug/dL (ref 28–170)
Saturation Ratios: 17 % (ref 10.4–31.8)
TIBC: 447 ug/dL (ref 250–450)
UIBC: 370 ug/dL

## 2024-07-07 LAB — FERRITIN: Ferritin: 22 ng/mL (ref 11–307)

## 2024-07-07 LAB — FOLATE: Folate: >20 ng/mL (ref >5.9)

## 2024-07-07 LAB — VITAMIN B12: Vitamin B-12: 632 pg/mL (ref 180–914)

## 2024-07-07 NOTE — Progress Notes (Unsigned)
 [facility]  [address] [place],  [place]  [address] [phone]  Clinic Day:  07/03/22  Referring physician: Jaime Rosaline RAMAN, F*  HISTORY OF PRESENT ILLNESS:  The patient is a 38 y.o. female  who I last saw in October 2025 for iron deficiency anemia.  The plan was for her to receive IV iron, which she never did.  Recent labs showed a low hemoglobin of 10.4.  Her B12 and folate levels were checked, which came back normal.    She comes in today to go over all of her recent labs to determine the etiology behind this.  Since her last visit, the patient has been doing well.  She continues to deny having any overt forms of blood loss to explain her anemia.  PHYSICAL EXAM:  There were no vitals taken for this visit. Wt Readings from Last 3 Encounters:  02/29/24 122 lb (55.3 kg)  09/08/22 112 lb 7 oz (51 kg)  07/06/22 113 lb 3.2 oz (51.3 kg)   There is no height or weight on file to calculate BMI. Performance status (ECOG): 1 - Symptomatic but completely ambulatory Physical Exam Constitutional:      Appearance: Normal appearance. She is not ill-appearing.  HENT:     Mouth/Throat:     Mouth: Mucous membranes are moist.     Pharynx: Oropharynx is clear. No oropharyngeal exudate or posterior oropharyngeal erythema.  Cardiovascular:     Rate and Rhythm: Normal rate and regular rhythm.     Heart sounds: No murmur heard.    No friction rub. No gallop.  Pulmonary:     Effort: Pulmonary effort is normal. No respiratory distress.     Breath sounds: Normal breath sounds. No wheezing, rhonchi or rales.  Abdominal:     General: Bowel sounds are normal. There is no distension.     Palpations: Abdomen is soft. There is no mass.     Tenderness: There is no abdominal tenderness.  Musculoskeletal:        General: No swelling.     Right lower leg: No edema.     Left lower leg: No edema.  Lymphadenopathy:     Cervical: No cervical adenopathy.      Upper Body:     Right upper body: No supraclavicular or axillary adenopathy.     Left upper body: No supraclavicular or axillary adenopathy.     Lower Body: No right inguinal adenopathy. No left inguinal adenopathy.  Skin:    General: Skin is warm.     Coloration: Skin is not jaundiced.     Findings: No lesion or rash.  Neurological:     General: No focal deficit present.     Mental Status: She is alert and oriented to person, place, and time. Mental status is at baseline.  Psychiatric:        Mood and Affect: Mood normal.        Behavior: Behavior normal.        Thought Content: Thought content normal.    LABS:      Latest Ref Rng & Units 09/08/2022    9:21 AM 07/06/2022   12:00 AM 05/25/2022   12:00 AM  CBC  WBC 4.0 - 10.5 K/uL 4.2  5.8     4.4      Hemoglobin 12.0 - 15.0 g/dL 88.8  88.2     88.9      Hematocrit 36.0 - 46.0 % 33.5  35  34      Platelets 150 - 400 K/uL 265  298     275         This result is from an external source.      Latest Ref Rng & Units 09/08/2022    9:21 AM 05/25/2022   12:00 AM 01/31/2017   11:37 AM  CMP  Glucose 70 - 99 mg/dL 96   898   BUN 6 - 20 mg/dL 17  9     10    Creatinine 0.44 - 1.00 mg/dL 9.27  0.8     9.26   Sodium 135 - 145 mmol/L 139  139     142   Potassium 3.5 - 5.1 mmol/L 3.9  3.6     5.2   Chloride 98 - 111 mmol/L 107  104     99   CO2 22 - 32 mmol/L 26  29     24    Calcium 8.9 - 10.3 mg/dL 9.4  9.4     89.8   Total Protein 6.0 - 8.5 g/dL   7.7   Total Bilirubin 0.0 - 1.2 mg/dL   0.2   Alkaline Phos 25 - 125  45     64   AST 13 - 35  30     22   ALT 7 - 35 U/L  23     21      This result is from an external source.    Latest Reference Range & Units 05/25/22 10:22   Iron 28 - 170 ug/dL 828 (H)   UIBC ug/dL 712   TIBC 749 - 549 ug/dL 541 (H)   Saturation Ratios 10.4 - 31.8 % 37 (H)   Ferritin 11 - 307 ng/mL 12   Folate >5.9 ng/mL >40.0   Transferrin Receptor 12.2 - 27.3 nmol/L 25.6   Vitamin B12 180 - 914 pg/mL 301    Total Protein ELP 6.0 - 8.5 g/dL 7.0   Albumin ELP 2.9 - 4.4 g/dL 3.9   Globulin, Total 2.2 - 3.9 g/dL 3.1 (C)   A/G Ratio 0.7 - 1.7  1.3 (C)   Alpha-1-Globulin 0.0 - 0.4 g/dL 0.2   Joeyj-7-Honalopw 0.4 - 1.0 g/dL 0.6   Beta Globulin 0.7 - 1.3 g/dL 0.9   Gamma Globulin 0.4 - 1.8 g/dL 1.3   M-SPIKE, % Not Observed g/dL Not Observed              ASSESSMENT & PLAN:  A 38 y.o. female who I recently began seeing for anemia.  In clinic today, I reviewed all of her recent labs with her.  When evaluating her iron parameters, her borderline low ferritin and elevated TIBC suggests a component of iron deficiency being present.  As she has had problems tolerating oral iron in the past, I will arrange for her to receive IV iron to bolster her iron stores and hopefully improve her hemoglobin over time.  Otherwise, I will see her back in 4 months to reassess her iron and hemoglobin levels to see how well she responded to her upcoming IV iron.  The patient understands all the plans discussed today and is in agreement with them.  Alianny Toelle DELENA Kerns, MD

## 2024-07-07 NOTE — Telephone Encounter (Signed)
 Patient has been scheduled for follow-up visit per 07/07/24 LOS.  Pt given an appt calendar with date and time.

## 2024-07-08 ENCOUNTER — Telehealth: Payer: Self-pay | Admitting: Oncology

## 2024-07-08 ENCOUNTER — Encounter: Payer: Self-pay | Admitting: Oncology

## 2024-07-08 ENCOUNTER — Telehealth: Payer: Self-pay

## 2024-07-08 LAB — PROTEIN ELECTROPHORESIS, SERUM
A/G Ratio: 1.1 (ref 0.7–1.7)
Albumin ELP: 3.9 g/dL (ref 2.9–4.4)
Alpha-1-Globulin: 0.3 g/dL (ref 0.0–0.4)
Alpha-2-Globulin: 0.8 g/dL (ref 0.4–1.0)
Beta Globulin: 1 g/dL (ref 0.7–1.3)
Gamma Globulin: 1.3 g/dL (ref 0.4–1.8)
Globulin, Total: 3.4 g/dL (ref 2.2–3.9)
Total Protein ELP: 7.3 g/dL (ref 6.0–8.5)

## 2024-07-08 LAB — SOLUBLE TRANSFERRIN RECEPTOR: Transferrin Receptor: 29.5 nmol/L — ABNORMAL HIGH (ref 12.2–27.3)

## 2024-07-08 NOTE — Telephone Encounter (Signed)
 Patient has been scheduled. Aware of appt date and time.    Scheduling Message Entered by Zachary, AMY W on 07/08/2024 at  3:20 PM Priority: High INFUSION 1HR30MIN (90)  Department: [facility]  Provider: Ezzard Valaria LABOR, MD  Appointment Notes:  needs IV iron per Ezzard

## 2024-07-08 NOTE — Telephone Encounter (Signed)
 Dr Ezzard: let pt know her iron levels are lower to where IV iron is needed. schedule ASAP. thx   Latest Reference Range & Units 07/07/24 16:09  Iron 28 - 170 ug/dL 77  UIBC ug/dL 629  TIBC 749 - 549 ug/dL 552  Saturation Ratios 10.4 - 31.8 % 17  Ferritin 11 - 307 ng/mL 22  Folate >5.9 ng/mL >20.0  Vitamin B12 180 - 914 pg/mL 632

## 2024-07-09 ENCOUNTER — Inpatient Hospital Stay

## 2024-07-10 DIAGNOSIS — M059 Rheumatoid arthritis with rheumatoid factor, unspecified: Secondary | ICD-10-CM | POA: Diagnosis not present

## 2024-07-10 DIAGNOSIS — D649 Anemia, unspecified: Secondary | ICD-10-CM | POA: Diagnosis not present

## 2024-07-10 DIAGNOSIS — K219 Gastro-esophageal reflux disease without esophagitis: Secondary | ICD-10-CM | POA: Diagnosis not present

## 2024-07-10 DIAGNOSIS — Z0000 Encounter for general adult medical examination without abnormal findings: Secondary | ICD-10-CM | POA: Diagnosis not present

## 2024-07-10 DIAGNOSIS — Z1389 Encounter for screening for other disorder: Secondary | ICD-10-CM | POA: Diagnosis not present

## 2024-07-16 ENCOUNTER — Ambulatory Visit

## 2024-07-21 ENCOUNTER — Encounter: Payer: Self-pay | Admitting: Oncology

## 2024-07-22 DIAGNOSIS — H66001 Acute suppurative otitis media without spontaneous rupture of ear drum, right ear: Secondary | ICD-10-CM | POA: Diagnosis not present

## 2024-07-22 NOTE — Telephone Encounter (Signed)
 Patient called and left message on triage voicemail at 14:14.   Patient reports that this morning she woke up and her hearing was notably worse. It is muffled. She is going to Urgent Care today for evaluation. She just wanted to update Dr. Woodward.  Patient's next f/u is in December.   Nurse did call patient back and left detailed message on her voicemail. Wanted to know if she had any pain or drainage associated with her symptoms? Is the hearing loss in her right ear (the better ear)? Has she noted any dizziness/vertigo/imbalance with symptoms? Asked her to call us  back with updates.   Dr. Sioshansi, any other considerations for patient?

## 2024-07-23 ENCOUNTER — Ambulatory Visit

## 2024-07-23 NOTE — Telephone Encounter (Signed)
 Patient returned our call and left a voicemail stating that yes she is having HL but she thinks it is better today. She thinks it is just from the infection. She is more so just in pain on the left side.   I returned her call, no answer. Left VM to call back to triage

## 2024-08-04 DIAGNOSIS — N898 Other specified noninflammatory disorders of vagina: Secondary | ICD-10-CM | POA: Diagnosis not present

## 2024-08-04 DIAGNOSIS — R300 Dysuria: Secondary | ICD-10-CM | POA: Diagnosis not present

## 2024-08-04 DIAGNOSIS — L292 Pruritus vulvae: Secondary | ICD-10-CM | POA: Diagnosis not present

## 2024-08-04 DIAGNOSIS — Z1231 Encounter for screening mammogram for malignant neoplasm of breast: Secondary | ICD-10-CM | POA: Diagnosis not present

## 2024-09-01 DIAGNOSIS — Z79899 Other long term (current) drug therapy: Secondary | ICD-10-CM | POA: Diagnosis not present

## 2024-09-01 DIAGNOSIS — M797 Fibromyalgia: Secondary | ICD-10-CM | POA: Diagnosis not present

## 2024-09-01 DIAGNOSIS — M0589 Other rheumatoid arthritis with rheumatoid factor of multiple sites: Secondary | ICD-10-CM | POA: Diagnosis not present

## 2024-11-05 NOTE — Progress Notes (Unsigned)
 "  [facility] [address] [place],  [place]  [address] [phone]  Clinic Day:  07/07/2024  Referring physician: No ref. provider found  HISTORY OF PRESENT ILLNESS:  The patient is a 39 y.o. female  who I last saw in September 2023 for iron deficiency anemia.  The plan was for her to receive IV iron, which she never did.  According to the patient, her insurance at the time did not financially cover the iron fusion and not to her liking.  Recent labs showed a low hemoglobin of 10.4.  Her B12 and folate levels were checked, which came back normal.  The patient claims to have menstrual cycles which last approximately 1 week, with 2-3 days being heavy.  She denies having other overt forms of blood loss to explain her iron deficiency anemia  PHYSICAL EXAM:  There were no vitals taken for this visit. Wt Readings from Last 3 Encounters:  07/07/24 120 lb 14.4 oz (54.8 kg)  02/29/24 122 lb (55.3 kg)  09/08/22 112 lb 7 oz (51 kg)   There is no height or weight on file to calculate BMI. Performance status (ECOG): 1 - Symptomatic but completely ambulatory Physical Exam Constitutional:      Appearance: Normal appearance. She is not ill-appearing.  HENT:     Mouth/Throat:     Mouth: Mucous membranes are moist.     Pharynx: Oropharynx is clear. No oropharyngeal exudate or posterior oropharyngeal erythema.  Cardiovascular:     Rate and Rhythm: Normal rate and regular rhythm.     Heart sounds: No murmur heard.    No friction rub. No gallop.  Pulmonary:     Effort: Pulmonary effort is normal. No respiratory distress.     Breath sounds: Normal breath sounds. No wheezing, rhonchi or rales.  Abdominal:     General: Bowel sounds are normal. There is no distension.     Palpations: Abdomen is soft. There is no mass.     Tenderness: There is no abdominal tenderness.  Musculoskeletal:        General: No swelling.     Right lower leg: No edema.     Left lower leg:  No edema.  Lymphadenopathy:     Cervical: No cervical adenopathy.     Upper Body:     Right upper body: No supraclavicular or axillary adenopathy.     Left upper body: No supraclavicular or axillary adenopathy.     Lower Body: No right inguinal adenopathy. No left inguinal adenopathy.  Skin:    General: Skin is warm.     Coloration: Skin is not jaundiced.     Findings: No lesion or rash.  Neurological:     General: No focal deficit present.     Mental Status: She is alert and oriented to person, place, and time. Mental status is at baseline.  Psychiatric:        Mood and Affect: Mood normal.        Behavior: Behavior normal.        Thought Content: Thought content normal.    LABS:      Latest Ref Rng & Units 07/07/2024    4:09 PM 09/08/2022    9:21 AM 07/06/2022   12:00 AM  CBC  WBC 4.0 - 10.5 K/uL 4.1  4.2  5.8      Hemoglobin 12.0 - 15.0 g/dL 89.9  88.8  88.2      Hematocrit 36.0 - 46.0 %  31.3  33.5  35      Platelets 150 - 400 K/uL 262  265  298         This result is from an external source.      Latest Ref Rng & Units 07/07/2024    4:09 PM 09/08/2022    9:21 AM 05/25/2022   12:00 AM  CMP  Glucose 70 - 99 mg/dL 94  96    BUN 6 - 20 mg/dL 8  17  9       Creatinine 0.44 - 1.00 mg/dL 9.19  9.27  0.8      Sodium 135 - 145 mmol/L 139  139  139      Potassium 3.5 - 5.1 mmol/L 3.8  3.9  3.6      Chloride 98 - 111 mmol/L 102  107  104      CO2 22 - 32 mmol/L 25  26  29       Calcium 8.9 - 10.3 mg/dL 9.6  9.4  9.4      Total Protein 6.5 - 8.1 g/dL 7.8     Total Bilirubin 0.0 - 1.2 mg/dL 0.4     Alkaline Phos 38 - 126 U/L 53   45      AST 15 - 41 U/L 18   30      ALT 0 - 44 U/L 12   23         This result is from an external source.    Latest Reference Range & Units 07/07/24 16:09  Iron 28 - 170 ug/dL 77  UIBC ug/dL 629  TIBC 749 - 549 ug/dL 552  Saturation Ratios 10.4 - 31.8 % 17  Ferritin 11 - 307 ng/mL 22  Folate >5.9 ng/mL >20.0  Vitamin B12 180 - 914 pg/mL 632    ASSESSMENT & PLAN:  A 39 y.o. female with a history of iron deficiency anemia.  When evaluating her labs today, her anemia is worse when compared to her last visit, as are some of her iron parameters.  Based upon this, I will arrange for her to receive IV iron over these next few weeks to replenish her iron stores and improve her hemoglobin.  I will see her back in 4 months to reassess her anemia to see how well she responded to her upcoming IV iron.  The patient understands all the plans discussed today and is in agreement with them.  Macel Yearsley DELENA Kerns, MD       "

## 2024-11-06 ENCOUNTER — Inpatient Hospital Stay

## 2024-11-06 ENCOUNTER — Inpatient Hospital Stay: Admitting: Oncology

## 2024-11-24 ENCOUNTER — Ambulatory Visit: Admitting: Neurology
